# Patient Record
Sex: Female | Born: 1935 | Race: White | Hispanic: No | Marital: Single | Smoking: Never smoker
Health system: Southern US, Community
[De-identification: ages and names within clinical notes are randomized; demographics above are authoritative.]

## PROBLEM LIST (undated history)

## (undated) DIAGNOSIS — G8929 Other chronic pain: Secondary | ICD-10-CM

## (undated) DIAGNOSIS — I2510 Atherosclerotic heart disease of native coronary artery without angina pectoris: Secondary | ICD-10-CM

## (undated) DIAGNOSIS — K219 Gastro-esophageal reflux disease without esophagitis: Secondary | ICD-10-CM

## (undated) DIAGNOSIS — Z9109 Other allergy status, other than to drugs and biological substances: Secondary | ICD-10-CM

## (undated) DIAGNOSIS — I10 Essential (primary) hypertension: Secondary | ICD-10-CM

## (undated) DIAGNOSIS — Z8639 Personal history of other endocrine, nutritional and metabolic disease: Secondary | ICD-10-CM

## (undated) DIAGNOSIS — M549 Dorsalgia, unspecified: Secondary | ICD-10-CM

## (undated) DIAGNOSIS — G459 Transient cerebral ischemic attack, unspecified: Secondary | ICD-10-CM

## (undated) DIAGNOSIS — N6019 Diffuse cystic mastopathy of unspecified breast: Secondary | ICD-10-CM

## (undated) DIAGNOSIS — D649 Anemia, unspecified: Secondary | ICD-10-CM

## (undated) DIAGNOSIS — E538 Deficiency of other specified B group vitamins: Secondary | ICD-10-CM

## (undated) DIAGNOSIS — H409 Unspecified glaucoma: Secondary | ICD-10-CM

## (undated) HISTORY — PX: OTHER SURGICAL HISTORY: SHX169

## (undated) HISTORY — PX: BREAST BIOPSY: SHX20

## (undated) HISTORY — PX: CATARACT EXTRACTION: SUR2

---

## 2004-09-22 ENCOUNTER — Ambulatory Visit: Payer: Self-pay | Admitting: Internal Medicine

## 2005-10-31 ENCOUNTER — Ambulatory Visit: Payer: Self-pay | Admitting: Internal Medicine

## 2007-01-30 ENCOUNTER — Ambulatory Visit: Payer: Self-pay | Admitting: Internal Medicine

## 2007-08-19 ENCOUNTER — Ambulatory Visit: Payer: Self-pay | Admitting: Vascular Surgery

## 2007-09-17 ENCOUNTER — Encounter: Payer: Self-pay | Admitting: Specialist

## 2007-10-12 ENCOUNTER — Encounter: Payer: Self-pay | Admitting: Specialist

## 2008-02-04 ENCOUNTER — Ambulatory Visit: Payer: Self-pay | Admitting: Internal Medicine

## 2009-03-09 ENCOUNTER — Ambulatory Visit: Payer: Self-pay | Admitting: Internal Medicine

## 2009-10-10 ENCOUNTER — Emergency Department: Payer: Self-pay | Admitting: Emergency Medicine

## 2009-10-20 ENCOUNTER — Emergency Department: Payer: Self-pay | Admitting: Emergency Medicine

## 2010-03-11 ENCOUNTER — Ambulatory Visit: Payer: Self-pay | Admitting: Internal Medicine

## 2010-03-17 ENCOUNTER — Ambulatory Visit: Payer: Self-pay | Admitting: Internal Medicine

## 2010-04-07 ENCOUNTER — Ambulatory Visit: Payer: Self-pay | Admitting: Surgery

## 2010-05-06 ENCOUNTER — Ambulatory Visit: Payer: Self-pay | Admitting: Surgery

## 2010-05-10 ENCOUNTER — Ambulatory Visit: Payer: Self-pay | Admitting: Surgery

## 2010-05-12 LAB — PATHOLOGY REPORT

## 2010-05-17 ENCOUNTER — Ambulatory Visit: Payer: Self-pay | Admitting: Radiation Oncology

## 2010-05-23 ENCOUNTER — Ambulatory Visit: Payer: Self-pay | Admitting: Radiation Oncology

## 2010-06-12 ENCOUNTER — Ambulatory Visit: Payer: Self-pay | Admitting: Radiation Oncology

## 2010-07-12 ENCOUNTER — Ambulatory Visit: Payer: Self-pay | Admitting: Radiation Oncology

## 2010-08-12 ENCOUNTER — Ambulatory Visit: Payer: Self-pay | Admitting: Radiation Oncology

## 2010-10-12 ENCOUNTER — Ambulatory Visit: Payer: Self-pay | Admitting: Radiation Oncology

## 2010-10-17 ENCOUNTER — Emergency Department: Payer: Self-pay | Admitting: Emergency Medicine

## 2010-11-12 ENCOUNTER — Ambulatory Visit: Payer: Self-pay | Admitting: Radiation Oncology

## 2011-03-15 ENCOUNTER — Ambulatory Visit: Payer: Self-pay | Admitting: General Surgery

## 2011-08-24 ENCOUNTER — Inpatient Hospital Stay: Payer: Self-pay | Admitting: Student

## 2011-08-24 LAB — COMPREHENSIVE METABOLIC PANEL
Alkaline Phosphatase: 89 U/L (ref 50–136)
Bilirubin,Total: 0.4 mg/dL (ref 0.2–1.0)
Co2: 25 mmol/L (ref 21–32)
Creatinine: 1.76 mg/dL — ABNORMAL HIGH (ref 0.60–1.30)
EGFR (African American): 29 — ABNORMAL LOW
Glucose: 113 mg/dL — ABNORMAL HIGH (ref 65–99)
Osmolality: 282 (ref 275–301)
Potassium: 4.1 mmol/L (ref 3.5–5.1)
SGPT (ALT): 10 U/L — ABNORMAL LOW
Sodium: 139 mmol/L (ref 136–145)
Total Protein: 6.1 g/dL — ABNORMAL LOW (ref 6.4–8.2)

## 2011-08-24 LAB — URINALYSIS, COMPLETE
Bacteria: NONE SEEN
Bilirubin,UR: NEGATIVE
Blood: NEGATIVE
Hyaline Cast: 4
Leukocyte Esterase: NEGATIVE
Nitrite: NEGATIVE
Ph: 6 (ref 4.5–8.0)
Protein: >=500
RBC,UR: <1 /HPF (ref 0–5)
Specific Gravity: 1.01 (ref 1.003–1.030)
Squamous Epithelial: 1
WBC UR: 1 /HPF (ref 0–5)

## 2011-08-24 LAB — MAGNESIUM: Magnesium: 1.9 mg/dL

## 2011-08-24 LAB — CBC
HCT: 29.8 % — ABNORMAL LOW (ref 35.0–47.0)
HGB: 9.8 g/dL — ABNORMAL LOW (ref 12.0–16.0)
MCH: 32.7 pg (ref 26.0–34.0)
MCHC: 33.1 g/dL (ref 32.0–36.0)
Platelet: 185 10*3/uL (ref 150–440)
WBC: 6.9 10*3/uL (ref 3.6–11.0)

## 2011-08-24 LAB — APTT: Activated PTT: 26.6 secs (ref 23.6–35.9)

## 2011-08-24 LAB — CK TOTAL AND CKMB (NOT AT ARMC)
CK, Total: 42 U/L (ref 21–215)
CK-MB: 1.1 ng/mL (ref 0.5–3.6)

## 2011-08-24 LAB — PROTIME-INR: Prothrombin Time: 12.4 secs (ref 11.5–14.7)

## 2011-08-25 DIAGNOSIS — I059 Rheumatic mitral valve disease, unspecified: Secondary | ICD-10-CM

## 2011-08-25 LAB — CBC WITH DIFFERENTIAL/PLATELET
Basophil #: 0 10*3/uL (ref 0.0–0.1)
Basophil %: 0.9 %
Eosinophil #: 0.3 10*3/uL (ref 0.0–0.7)
Eosinophil %: 5.4 %
Lymphocyte #: 0.5 10*3/uL — ABNORMAL LOW (ref 1.0–3.6)
Lymphocyte %: 10.8 %
MCH: 33 pg (ref 26.0–34.0)
MCHC: 33.4 g/dL (ref 32.0–36.0)
MCV: 99 fL (ref 80–100)
Monocyte %: 11.9 %
RDW: 12.6 % (ref 11.5–14.5)
WBC: 4.8 10*3/uL (ref 3.6–11.0)

## 2011-08-25 LAB — COMPREHENSIVE METABOLIC PANEL
Anion Gap: 9 (ref 7–16)
BUN: 24 mg/dL — ABNORMAL HIGH (ref 7–18)
Bilirubin,Total: 0.4 mg/dL (ref 0.2–1.0)
Chloride: 108 mmol/L — ABNORMAL HIGH (ref 98–107)
Co2: 26 mmol/L (ref 21–32)
EGFR (African American): 30 — ABNORMAL LOW
EGFR (Non-African Amer.): 26 — ABNORMAL LOW

## 2011-08-25 LAB — LIPID PANEL
HDL Cholesterol: 36 mg/dL — ABNORMAL LOW (ref 40–60)
Triglycerides: 124 mg/dL (ref 0–200)

## 2011-08-25 LAB — TROPONIN I: Troponin-I: 0.3 ng/mL — ABNORMAL HIGH

## 2011-08-25 LAB — CK TOTAL AND CKMB (NOT AT ARMC)
CK, Total: 38 U/L (ref 21–215)
CK, Total: 41 U/L (ref 21–215)
CK-MB: 1.2 ng/mL (ref 0.5–3.6)

## 2011-08-25 LAB — MAGNESIUM: Magnesium: 1.8 mg/dL

## 2011-08-26 LAB — BASIC METABOLIC PANEL
Anion Gap: 12 (ref 7–16)
BUN: 26 mg/dL — ABNORMAL HIGH (ref 7–18)
Calcium, Total: 8 mg/dL — ABNORMAL LOW (ref 8.5–10.1)
Co2: 24 mmol/L (ref 21–32)
Creatinine: 1.93 mg/dL — ABNORMAL HIGH (ref 0.60–1.30)
EGFR (African American): 26 — ABNORMAL LOW
EGFR (Non-African Amer.): 23 — ABNORMAL LOW
Glucose: 106 mg/dL — ABNORMAL HIGH (ref 65–99)
Sodium: 140 mmol/L (ref 136–145)

## 2011-08-27 LAB — TSH: Thyroid Stimulating Horm: 1.87 u[IU]/mL

## 2011-08-28 LAB — BASIC METABOLIC PANEL
Anion Gap: 9 (ref 7–16)
BUN: 27 mg/dL — ABNORMAL HIGH (ref 7–18)
Calcium, Total: 7.8 mg/dL — ABNORMAL LOW (ref 8.5–10.1)
Co2: 24 mmol/L (ref 21–32)
Creatinine: 2.02 mg/dL — ABNORMAL HIGH (ref 0.60–1.30)
EGFR (African American): 25 — ABNORMAL LOW
Glucose: 95 mg/dL (ref 65–99)
Osmolality: 284 (ref 275–301)
Potassium: 3.4 mmol/L — ABNORMAL LOW (ref 3.5–5.1)
Sodium: 140 mmol/L (ref 136–145)

## 2011-08-28 LAB — CBC WITH DIFFERENTIAL/PLATELET
Basophil #: 0 10*3/uL (ref 0.0–0.1)
Basophil %: 0.9 %
Eosinophil #: 0.2 10*3/uL (ref 0.0–0.7)
Eosinophil %: 2.7 %
HCT: 28.8 % — ABNORMAL LOW (ref 35.0–47.0)
HGB: 9.7 g/dL — ABNORMAL LOW (ref 12.0–16.0)
Lymphocyte #: 0.3 10*3/uL — ABNORMAL LOW (ref 1.0–3.6)
MCH: 33.2 pg (ref 26.0–34.0)
MCV: 99 fL (ref 80–100)
Monocyte %: 9.1 %
Neutrophil #: 4.6 10*3/uL (ref 1.4–6.5)
RBC: 2.93 10*6/uL — ABNORMAL LOW (ref 3.80–5.20)
RDW: 13.3 % (ref 11.5–14.5)
WBC: 5.7 10*3/uL (ref 3.6–11.0)

## 2011-08-29 LAB — BASIC METABOLIC PANEL
Anion Gap: 10 (ref 7–16)
Calcium, Total: 7.9 mg/dL — ABNORMAL LOW (ref 8.5–10.1)
Co2: 24 mmol/L (ref 21–32)
Creatinine: 2.01 mg/dL — ABNORMAL HIGH (ref 0.60–1.30)
EGFR (African American): 25 — ABNORMAL LOW
EGFR (Non-African Amer.): 21 — ABNORMAL LOW
Osmolality: 286 (ref 275–301)

## 2011-08-30 LAB — BASIC METABOLIC PANEL
Anion Gap: 10 (ref 7–16)
Co2: 23 mmol/L (ref 21–32)
Glucose: 105 mg/dL — ABNORMAL HIGH (ref 65–99)
Osmolality: 274 (ref 275–301)
Sodium: 134 mmol/L — ABNORMAL LOW (ref 136–145)

## 2011-08-31 LAB — BASIC METABOLIC PANEL
Calcium, Total: 8.1 mg/dL — ABNORMAL LOW (ref 8.5–10.1)
Co2: 26 mmol/L (ref 21–32)
Creatinine: 2.54 mg/dL — ABNORMAL HIGH (ref 0.60–1.30)
EGFR (Non-African Amer.): 16 — ABNORMAL LOW
Glucose: 97 mg/dL (ref 65–99)
Osmolality: 284 (ref 275–301)
Potassium: 3.9 mmol/L (ref 3.5–5.1)
Sodium: 139 mmol/L (ref 136–145)

## 2011-09-01 LAB — BASIC METABOLIC PANEL
BUN: 31 mg/dL — ABNORMAL HIGH (ref 7–18)
Calcium, Total: 8.2 mg/dL — ABNORMAL LOW (ref 8.5–10.1)
Creatinine: 2.42 mg/dL — ABNORMAL HIGH (ref 0.60–1.30)
EGFR (Non-African Amer.): 17 — ABNORMAL LOW
Glucose: 101 mg/dL — ABNORMAL HIGH (ref 65–99)
Potassium: 4 mmol/L (ref 3.5–5.1)
Sodium: 141 mmol/L (ref 136–145)

## 2011-09-01 LAB — IRON AND TIBC
Iron Bind.Cap.(Total): 197 ug/dL — ABNORMAL LOW (ref 250–450)
Iron Saturation: 21 %

## 2011-09-01 LAB — PROTEIN / CREATININE RATIO, URINE: Creatinine, Urine: 36.6 mg/dL (ref 30.0–125.0)

## 2011-09-01 LAB — SODIUM, URINE, RANDOM: Sodium, Urine Random: 68 mmol/L (ref 20–110)

## 2011-09-01 LAB — FERRITIN: Ferritin (ARMC): 108 ng/mL (ref 8–388)

## 2011-09-01 LAB — URINALYSIS, COMPLETE
Bilirubin,UR: NEGATIVE
Glucose,UR: NEGATIVE mg/dL (ref 0–75)
Ketone: NEGATIVE
Leukocyte Esterase: NEGATIVE
Nitrite: NEGATIVE
Ph: 6 (ref 4.5–8.0)

## 2011-09-02 LAB — BASIC METABOLIC PANEL
Anion Gap: 10 (ref 7–16)
BUN: 27 mg/dL — ABNORMAL HIGH (ref 7–18)
Chloride: 107 mmol/L (ref 98–107)
Co2: 26 mmol/L (ref 21–32)
Creatinine: 2.33 mg/dL — ABNORMAL HIGH (ref 0.60–1.30)
EGFR (African American): 21 — ABNORMAL LOW
EGFR (Non-African Amer.): 18 — ABNORMAL LOW
Glucose: 91 mg/dL (ref 65–99)
Osmolality: 290 (ref 275–301)
Potassium: 3.6 mmol/L (ref 3.5–5.1)
Sodium: 143 mmol/L (ref 136–145)

## 2011-09-03 LAB — BASIC METABOLIC PANEL
BUN: 24 mg/dL — ABNORMAL HIGH (ref 7–18)
Chloride: 107 mmol/L (ref 98–107)
EGFR (African American): 22 — ABNORMAL LOW
Osmolality: 280 (ref 275–301)
Potassium: 3.9 mmol/L (ref 3.5–5.1)

## 2011-09-04 LAB — PROTEIN ELECTROPHORESIS(ARMC)

## 2011-09-05 LAB — BASIC METABOLIC PANEL
Anion Gap: 8 (ref 7–16)
BUN: 22 mg/dL — ABNORMAL HIGH (ref 7–18)
Calcium, Total: 7.8 mg/dL — ABNORMAL LOW (ref 8.5–10.1)
Chloride: 106 mmol/L (ref 98–107)
Co2: 25 mmol/L (ref 21–32)
EGFR (African American): 23 — ABNORMAL LOW
EGFR (Non-African Amer.): 20 — ABNORMAL LOW
Glucose: 85 mg/dL (ref 65–99)
Osmolality: 280 (ref 275–301)
Potassium: 4.3 mmol/L (ref 3.5–5.1)
Sodium: 139 mmol/L (ref 136–145)

## 2011-09-06 LAB — HEMOGLOBIN: HGB: 9.3 g/dL — ABNORMAL LOW (ref 12.0–16.0)

## 2011-09-06 LAB — BASIC METABOLIC PANEL
Anion Gap: 8 (ref 7–16)
Co2: 27 mmol/L (ref 21–32)
EGFR (African American): 22 — ABNORMAL LOW
EGFR (Non-African Amer.): 19 — ABNORMAL LOW
Glucose: 105 mg/dL — ABNORMAL HIGH (ref 65–99)
Osmolality: 279 (ref 275–301)
Potassium: 3.8 mmol/L (ref 3.5–5.1)
Sodium: 138 mmol/L (ref 136–145)

## 2011-09-08 ENCOUNTER — Encounter: Payer: Self-pay | Admitting: Internal Medicine

## 2011-09-11 ENCOUNTER — Encounter: Payer: Self-pay | Admitting: Internal Medicine

## 2011-09-12 ENCOUNTER — Emergency Department: Payer: Self-pay

## 2011-09-12 LAB — COMPREHENSIVE METABOLIC PANEL
Albumin: 2.6 g/dL — ABNORMAL LOW (ref 3.4–5.0)
Alkaline Phosphatase: 65 U/L (ref 50–136)
Anion Gap: 8 (ref 7–16)
BUN: 46 mg/dL — ABNORMAL HIGH (ref 7–18)
Calcium, Total: 7.8 mg/dL — ABNORMAL LOW (ref 8.5–10.1)
Chloride: 100 mmol/L (ref 98–107)
Creatinine: 2.73 mg/dL — ABNORMAL HIGH (ref 0.60–1.30)
EGFR (Non-African Amer.): 15 — ABNORMAL LOW
Glucose: 92 mg/dL (ref 65–99)
SGOT(AST): 20 U/L (ref 15–37)
SGPT (ALT): 15 U/L
Sodium: 134 mmol/L — ABNORMAL LOW (ref 136–145)
Total Protein: 5.7 g/dL — ABNORMAL LOW (ref 6.4–8.2)

## 2011-09-12 LAB — DRUG SCREEN, URINE
Amphetamines, Ur Screen: NEGATIVE (ref ?–1000)
Barbiturates, Ur Screen: NEGATIVE (ref ?–200)
Benzodiazepine, Ur Scrn: NEGATIVE (ref ?–200)
Cannabinoid 50 Ng, Ur ~~LOC~~: NEGATIVE (ref ?–50)
Methadone, Ur Screen: NEGATIVE (ref ?–300)
Opiate, Ur Screen: NEGATIVE (ref ?–300)
Tricyclic, Ur Screen: NEGATIVE (ref ?–1000)

## 2011-09-12 LAB — CBC
HCT: 25.2 % — ABNORMAL LOW (ref 35.0–47.0)
HGB: 8.5 g/dL — ABNORMAL LOW (ref 12.0–16.0)
MCH: 33.4 pg (ref 26.0–34.0)
MCHC: 33.6 g/dL (ref 32.0–36.0)
MCV: 99 fL (ref 80–100)
Platelet: 204 10*3/uL (ref 150–440)
RBC: 2.54 10*6/uL — ABNORMAL LOW (ref 3.80–5.20)
RDW: 13.7 % (ref 11.5–14.5)
WBC: 7.2 10*3/uL (ref 3.6–11.0)

## 2011-09-12 LAB — URINALYSIS, COMPLETE
Bacteria: NONE SEEN
Glucose,UR: NEGATIVE mg/dL (ref 0–75)
Ketone: NEGATIVE
Protein: NEGATIVE
Specific Gravity: 1.003 (ref 1.003–1.030)
WBC UR: 25 /HPF (ref 0–5)

## 2011-09-12 LAB — SALICYLATE LEVEL: Salicylates, Serum: <1.7 mg/dL

## 2011-09-12 LAB — ETHANOL: Ethanol %: <0.003 % (ref 0.000–0.080)

## 2011-09-12 LAB — TSH: Thyroid Stimulating Horm: 1.7 u[IU]/mL

## 2011-09-12 LAB — ACETAMINOPHEN LEVEL: Acetaminophen: 3 ug/mL — ABNORMAL LOW

## 2011-09-13 LAB — BASIC METABOLIC PANEL
Anion Gap: 8 (ref 7–16)
Calcium, Total: 7.9 mg/dL — ABNORMAL LOW (ref 8.5–10.1)
Co2: 25 mmol/L (ref 21–32)
EGFR (African American): 17 — ABNORMAL LOW
Potassium: 4.6 mmol/L (ref 3.5–5.1)

## 2011-09-15 LAB — URINE CULTURE

## 2013-10-11 ENCOUNTER — Emergency Department: Payer: Self-pay | Admitting: Emergency Medicine

## 2014-07-05 NOTE — Consult Note (Signed)
PATIENT NAME:  Jill, Clayton MR#:  [id number] DATE OF BIRTH:  [birth year]  DATE OF CONSULTATION:  09/06/2011  REFERRING PHYSICIAN:  Vivien Presto, MD CONSULTING PHYSICIAN:  Verdie Shire, MD / Payton Emerald, NP  REASON FOR CONSULTATION: Continued nausea and belching.   HISTORY OF PRESENT ILLNESS: Jill Clayton is an 81 year old Caucasian female who has a significant past medical history of hypertension, hyperlipidemia, chronic kidney disease stage IV, and complete occlusive right renal artery and left renal artery stenosis, as well as history of breast cancer status post excision, osteoporosis, history of partial seizures, vitamin B12 deficiency, and basal cell carcinoma. She was admitted with a syncopal episode. She was found to have rhythm disturbance including bradycardia as well as SVT. She was found to have some concern for possible stenosis of left internal carotid artery and the patient was seen by vascular surgery for this concern.   Gastroenterology consultation is requested per family request as they are very concerned with the excessive amount of belching  which the patient has experienced during her hospitalization. The patient is a poor historian, but is able to state that she did not experience this belching while she was at home. Nausea is denied at this time, although it has been present during her hospitalization. No abdominal pain and no vomiting. Unable to elaborate when her last bowel movement was. No family members are present during interviewing process. The patient denies history of ulcers, denies that she has ever had an upper endoscopy performed in the past, and no evidence noted in review of EMR.   PAST MEDICAL HISTORY:  1. Hypertension.  2. Hyperlipidemia.  3. Chronic kidney disease stage IV.  4. Complete occlusive right renal artery and left renal artery stenosis. 5. History of breast cancer status post excision. 6. Basal cell carcinoma.   PAST SURGICAL HISTORY:   1. Tonsillectomy.  2. Right kidney.   FAMILY HISTORY: Noncontributory.   SOCIAL HISTORY: No tobacco or alcohol use.  REVIEW OF SYSTEMS: CONSTITUTIONAL: Denies any fevers. Significant for weight loss. HEENT: Denies any blurred vision. No dysphagia and no epistaxis. RESPIRATORY: No coughing, no wheezing, and no respiratory distress. CARDIOVASCULAR: Significant for arrhythmia on admission. No chest pain. GASTROINTESTINAL: See History of Present Illness. GENITOURINARY: No dysuria. No hematuria. ENDOCRINE: No polyuria or polydipsia. MUSCULOSKELETAL: Denies arthralgias or myalgias. All 10 systems are unremarkable in reviewing History and Physical on admission as well.   PHYSICAL EXAMINATION:   VITAL SIGNS: Temperature 97.8, pulse 78, respirations 18, blood pressure 136/53, and pulse oximetry 92% on room air.   GENERAL: Well-developed, slender 81 year old Caucasian female with audible evidence of  intractable belching except for when the patient was asked to hold her breath.   HEENT: Normocephalic, atraumatic. Pupils equal and reactive to light. Conjunctivae clear. Sclerae anicteric.   NECK: Supple. Trachea midline. No lymphadenopathy or thyromegaly.   PULMONARY: Symmetric rise and fall of chest. Clear to auscultation anteriorly as well as posteriorly.   CARDIOVASCULAR: Regular rhythm, S1 and S2.   ABDOMEN: Soft and nondistended. Bowel sounds in four quadrants. No bruits. No masses.   RECTAL: Deferred.   MUSCULOSKELETAL: Movement of all four extremities. No contractures. No clubbing.   SKIN: Warm and dry. Color pale. No lesions. No rashes.   EXTREMITIES: No edema.   PSYCH: Alert and oriented x3. Flat affect. Depressive mood.   LABORATORY/RADIOLOGIC: All laboratory studies as well as diagnostic studies reviewed by myself during her hospitalization. Most recent ones, chemistry, on today's date: Glucose 105, BUN 22,  creatinine 2.26, and calcium is low at 8.2. EGFR FOR non-African-American  is low at 19. Platelet count is 250 with a hemoglobin of 9.3.  Chest, single view, done 09/05/2011, reveals a small left pleural effusion and some basilar atelectasis and pulmonary vascular congestion with mild edema.   IMPRESSION AND PLAN: An 81 year old Caucasian female who presented to the hospital for concerns of syncopal episode and was found to have rhythm disturbance, bradycardia versus supraventricular tachycardia, known history of chronic renal disease stage IV as well as hypertension and hyperlipidemia. Gastroenterology consult is requested for excessive belching with associated nausea which according to the patient is resolved at this time. I do feel that there is possibly a psychosomatic component which is the underlying reason for excessive belching. It is possible that this is an underlying factor in reviewing EMR that her husband passed away at the beginning of this month. If the patient is not on PPI therapy and there is a concern of reflux, do recommend PPI therapy being initiated. In review of renal consultation note today, it is noted that palliative care is recommended. Agreement with palliative care evaluation. We will await their recommendations. I do not feel that endoscopic evaluation is warranted at this time. We will monitor. Would recommend though the initiation of simethicone 125 mg capsule one capsule before meals by 30 minutes and at bedtime as this will help absorb excessive gas production, which may be thus leading to excessive belching in correlation with possible dyspepsia.   Thank you for allowing [place] to participate in the care of Jill Clayton during her hospitalization.  These services provided by Payton Emerald, MS, APRN, [facility], FNP under collaborative agreement with Dr. Verdie Shire.  ____________________________ Payton Emerald, NP dsh:slb D: 09/06/2011 13:57:39 ET     T: 09/06/2011 16:17:36 ET        JOB#: [id number] DAWN Ruthe Mannan MD ELECTRONICALLY SIGNED 09/08/2011  16:47

## 2014-07-05 NOTE — Consult Note (Signed)
General Aspect Carotid stenosis    Present Illness The patient is an 81 year old female who presented to [facility] after an episode of syncope.  When she passed out, reportedly she lost consciousness. Patient brought in by EMS. Patient still was feeling nauseous even after she woke up. Denied any chest pain. No dizziness. Patient did not have any headache. No aura symptoms or post ictal behavior by ER report. This is the first ond only time this has occurred.  No report of AF or TIA symptoms.   PAST MEDICAL HISTORY: 1. History of right infiltrating ductal carcinoma of the breast with lymphovascular invasion. 2. History of depression. 3. Hypertension.  4. Osteoporosis. 5. Hyperlipidemia.  6. History of cerebrovascular infarcts.  7. Previous history of seizures.  8. History of B12 deficiency. 9. Anemia.  10. Low back pain.  11. Mild to moderate mitral regurgitation.   PAST SURGICAL HISTORY:  1. History of cataract surgery.  2. Basal cell carcinoma of the skin.  3. Breast biopsy.   Home Medications: Medication Instructions Status  atenolol 50 mg oral tablet 1 tab(s) orally once a day  Active  Evista 60 mg oral tablet 1 tab(s) orally once a day  Active  potassium chloride 10 mEq oral capsule, extended release 1 cap(s) orally once a day  Active  clonidine 0.1 mg oral tablet 1 tab(s) orally 2 times a day Active  furosemide 40 mg oral tablet 1 tab(s) orally 2 times a day Active  dipyridamole 75 mg oral tablet 1 tab(s) orally 2 times a day Active  citalopram 20 mg oral tablet 1 tab(s) orally once a day for depression Active  losartan 100 mg oral tablet 1 tab(s) orally once a day Active  nystatin-triamcinolone 100,000 units/g-0.1% topical cream 1 application sparingly and rub in well to affected areas 2 times a day, As Needed Active  Aspirin Low Dose 81 mg oral tablet 1 tab(s) orally once a day Active    Feldene: Hives  Codeine: GI Distress  Ace Inhibitors: Cough  Other -Explain in  Comment: GI Distress  Vasotec: Other  Tape: Other  Case History:   Family History Non-Contributory    Social History negative tobacco, negative ETOH, negative Illicit drugs   Review of Systems:   ROS Pt not able to provide ROS   Physical Exam:   GEN well developed, thin    HEENT dry oral mucosa, poor dentition    NECK supple  trachea midline    RESP normal resp effort  no use of accessory muscles    CARD regular rate  no carotid bruits  no JVD    ABD denies tenderness  soft    EXTR negative cyanosis/clubbing, negative edema    SKIN No rashes, skin turgor poor    PSYCH lethargic   Nursing/Ancillary Notes: **Vital Signs.:   15-Jun-13 11:56   Vital Signs Type Routine   Temperature Temperature (F) 97.4   Celsius 36.3   Temperature Source oral   Pulse Pulse 70   Pulse source per vital sign device   Respirations Respirations 18   Systolic BP Systolic BP 132   Diastolic BP (mmHg) Diastolic BP (mmHg) 59   Mean BP 83   BP Source vital sign device   Pulse Ox % Pulse Ox % 94   Pulse Ox Activity Level  At rest   Oxygen Delivery Room Air/ 21 %   Hepatic:  14-Jun-13 02:11    Bilirubin, Total 0.4   Alkaline Phosphatase 82  SGPT (ALT)  10 (12-78 NOTE: NEW REFERENCE RANGE 02/03/2011)   SGOT (AST) 19   Total Protein, Serum  5.8   Albumin, Serum  2.6  Cardiology:  14-Jun-13 18:30    Echo Doppler  Interpretation Summary   Left ventricular systolic function is normal. Ejection Fraction =  >55%. The transmitral spectral Doppler flow pattern is suggestive of  impaired LV relaxation. The right ventricular systolic function is  normal. The left atrial size is normal. There is mild to moderate  mitral regurgitation. Right ventricular systolic pressure is  elevated at 30-46mmHg.  Procedure:   The study was completed  bedside.   A two-dimensional transthoracic echocardiogram with colorflow and  Doppler was performed.  Left Ventricle   Ejection Fraction = >55%.    The transmitral spectral Doppler flow pattern is suggestive of  impaired LV relaxation.   The left ventricular wall motion is normal.   The left ventricle is normal in size.   There is mild concentric left ventricular hypertrophy.   Left ventricular systolic function is normal.  Right Ventricle   The right ventricle is normal size.   The right ventricular systolic function is normal.  Atria   The left atrial size is normal.   Right atrial size is normal.   There is no Doppler evidence for an atrial septal defect.  Mitral Valve   The mitral valve leaflets appear normal. There is no evidence of  stenosis, fluttering, or prolapse.   There is mild to moderate mitral regurgitation.   There is no mitral valve stenosis.  Tricuspid Valve   The tricuspid valve is normal.   There is mild tricuspid regurgitation.   Right ventricular systolic pressure is elevated at 30-70mmHg.  Aortic Valve   The aortic valve opens well.   Mild aortic regurgitation.   No hemodynamically significant valvular aortic stenosis.  Pulmonic Valve   The pulmonic valve leaflets are thin and pliable; valve motion is  normal.   Trace pulmonic valvular regurgitation.  Great Vessels   The aortic root is normal size.   The pulmonary is not well visualized.  Pericardium/Pleural   There is no pleural effusion.   No pericardial effusion.  MMode 2D Measurements and Calculations   IVSd: 1.3 cm   LVIDd: 3.7 cm   LVIDs: 2.6 cm   LVPWd: 1.3 cm   FS: 28 %   EF(Teich): 56 %   Ao root diam: 2.6 cm   ACS: 1.4 cm   LA dimension: 3.7 cm   LVOT diam: 1.7 cm  Doppler Measurements and Calculations   MV E point: 86 cm/sec   MV A point: 95 cm/sec   MV E/A: 0.90    MV V2 max: 105 cm/sec   MV max PG: 4.0 mmHg   MV V2 mean: 67 cm/sec   MV mean PG: 2.0 mmHg   MV V2 VTI: 36 cm   MV P1/2t max vel: 86 cm/sec   MV P1/2t: 81 msec   MVA(P1/2t): 2.7 cm2   MV dec slope: 313 cm/sec2   MV dec time: 0.28 sec   Ao V2 max:  179 cm/sec   Ao max PG: 13 mmHg   Ao V2 mean: 111 cm/sec   Ao mean PG: 5.8 mmHg   Ao V2 VTI: 36 cm   AVA(I,D): 1.7 cm2   AVA(V,D): 1.4 cm2   AI max vel: 178 cm/sec   AI max PG: 13 mmHg   AI dec slope: 307 cm/sec2   AI P1/2t: 170  msec   LV max PG: 5.0 mmHg   LV mean PG: 2.4 mmHg   LV V1 max: 112 cm/sec   LV V1 mean: 70 cm/sec   LV V1 VTI: 27 cm   MR max vel: 586 cm/sec   MR max PG: 137 mmHg   SV(LVOT): 60 ml   PA V2 max: 121 cm/sec   PA max PG: 6.0 mmHg   PA acc time: 0.12 sec   PI end-d vel: 60 cm/sec   TR Max vel: 288 cm/sec   TR Max PG: 32 mmHg   RVSP: 37 mmHg   RAP systole: 5.0 mmHg   PA pr(Accel): 25 mmHg  Reading Physician: Ida Rogue  Sonographer: Jaci Standard Interpreting Physician:  Ida Rogue,  electronically signed on  08-25-2011 19:21:24 Requesting Physician: Ida Rogue  Routine Chem:  14-Jun-13 02:11    Glucose, Serum 92   BUN  24   Creatinine (comp)  1.71   Sodium, Serum 143   Potassium, Serum 3.7   Chloride, Serum  108   CO2, Serum 26   Calcium (Total), Serum  8.1   Anion Gap 9   Osmolality (calc) 289   eGFR (African American)  30   eGFR (Non-African American)  26 (eGFR values <54mL/min/1.73 m2 may be an indication of chronic kidney disease (CKD). Calculated eGFR is useful in patients with stable renal function. The eGFR calculation will not be reliable in acutely ill patients when serum creatinine is changing rapidly. It is not useful in  patients on dialysis. The eGFR calculation may not be applicable to patients at the low and high extremes of body sizes, pregnant women, and vegetarians.)   Result Comment TROPONIN - RESULTS VERIFIED BY REPEAT TESTING.  - PREVIOUS CALL:08/24/11@1858 .Marland KitchenMarland KitchenTPL  Result(s) reported on 25 Aug 2011 at 03:27AM.   Magnesium, Serum 1.8 (1.8-2.4 THERAPEUTIC RANGE: 4-7 mg/dL TOXIC: > 10 mg/dL  -----------------------)   Hemoglobin A1c ([facility]) 5.0 (The American Diabetes Association recommends that a primary  goal of therapy should be <7% and that physicians should reevaluate the treatment regimen in patients with HbA1c values consistently >8%.)   Cholesterol, Serum 159   Triglycerides, Serum 124   HDL (INHOUSE)  36   VLDL Cholesterol Calculated 25   LDL Cholesterol Calculated 98 (Result(s) reported on 25 Aug 2011 at 03:55AM.)    09:27    Result Comment TROPONIN - RESULTS VERIFIED BY REPEAT TESTING.  - PREV. C/ 08-24-11 @1858  BY EAB.Marland KitchenAJO  Result(s) reported on 25 Aug 2011 at 10:35AM.  15-Jun-13 05:12    Glucose, Serum  106   BUN  26   Creatinine (comp)  1.93   Sodium, Serum 140   Potassium, Serum  3.4   Chloride, Serum 104   CO2, Serum 24   Calcium (Total), Serum  8.0   Anion Gap 12   Osmolality (calc) 285   eGFR (African American)  26   eGFR (Non-African American)  23 (eGFR values <17mL/min/1.73 m2 may be an indication of chronic kidney disease (CKD). Calculated eGFR is useful in patients with stable renal function. The eGFR calculation will not be reliable in acutely ill patients when serum creatinine ischanging rapidly. It is not useful in  patients on dialysis. The eGFR calculation may not be applicable to patients at the low and high extremes of body sizes, pregnant women, and vegetarians.)  Cardiac:  14-Jun-13 02:11    Troponin I  0.30 (0.00-0.05 0.05 ng/mL or less: NEGATIVE  Repeat testing in 3-6 hrs  if clinically  indicated. >0.05 ng/mL: POTENTIAL  MYOCARDIAL INJURY. Repeat  testing in 3-6 hrs if  clinically indicated. NOTE: An increase or decrease  of 30% or more on serial  testing suggests a  clinically important change)   CK, Total 38   CPK-MB, Serum 1.2 (Result(s) reported on 25 Aug 2011 at 03:16AM.)    09:27    Troponin I  0.31 (0.00-0.05 0.05 ng/mL or less: NEGATIVE  Repeat testing in 3-6 hrs  if clinically indicated. >0.05 ng/mL: POTENTIAL  MYOCARDIAL INJURY. Repeat  testing in 3-6 hrs if  clinically indicated. NOTE: An increase or decrease  of 30%  or more on serial  testing suggests a  clinically important change)   CK, Total 41   CPK-MB, Serum 1.5 (Result(s) reported on 25 Aug 2011 at 10:31AM.)  Routine Hem:  14-Jun-13 02:11    WBC (CBC) 4.8   RBC (CBC)  3.09   Hemoglobin (CBC)  10.2   Hematocrit (CBC)  30.5   Platelet Count (CBC) 190   MCV 99   MCH 33.0   MCHC 33.4   RDW 12.6   Neutrophil % 71.0   Lymphocyte % 10.8   Monocyte % 11.9   Eosinophil % 5.4   Basophil % 0.9   Neutrophil # 3.4   Lymphocyte #  0.5   Monocyte # 0.6   Eosinophil # 0.3   Basophil # 0.0 (Result(s) reported on 25 Aug 2011 at 02:46AM.)     Impression 1. Carotid stenosis.  By ultrasound report there is a visual stenosis but this is not corroborated by the velocities obtained on the scan.  Her symptoms to not correlate with unilateral stenosis as syncope is a global symptom not a focal symptom and I do not believe that her carotid lesion is symptomactic.  Given her renal function a CT angio is not reasonable.  I recommend that her cardiac work up continue and once she is discharged from the hospital she can follow up in my office for a repeat carotid scan to resolve this discrepency.  2. Syncope. At this time syncope is probably secondary to cardiac origin with bradycardia. Hold the beta blockers.  Continue the aspirin. Unable to give beta blocker due to bradycardia. If CT head is negative will start Lovenox for her and continue ARB.  3. Acute renal failure.  IV fluids for rehydration as needed.   Avoid nephrotoxic agents. Avoid Lasix and KCl.  4. Bradycardia. Because of bradycardia and will hold the clonidine also at this time. We will also check orthostatic vitals as well and use hydralazine for blood pressure issues. 5.           Hypertension.  Continue losartin    Plan level 4   Electronic Signatures: Hortencia Pilar (MD)  (Signed 15-Jun-13 16:22)  Authored: General Aspect/Present Illness, Home Medications, Allergies, History and Physical Exam,  Vital Signs, Labs, Impression/Plan   Last Updated: 15-Jun-13 16:22 by Hortencia Pilar (MD)
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
## 2014-07-05 NOTE — H&P (Signed)
PATIENT NAME:  Jill Clayton, WISENER MR#:  [id number] DATE OF BIRTH:  1919-08-22  DATE OF ADMISSION:  08/24/2011  PRIMARY CARE PHYSICIAN: Dr. Alonna Buckler  ER PHYSICIAN: Dr. Enedina Finner    CHIEF COMPLAINT: Syncope.   HISTORY OF PRESENT ILLNESS: 81 year old female with history of hypertension, breast cancer came in because patient had syncope while she was doing some work. She was at the bank. Patient says that she does not remember anything and before she passed out she was feeling very nauseous and she was thinking she could go home and take some rest. When she passed out reportedly she lost consciousness and we don't know how long she lost it but she said she had loss of consciousness for a good while. Patient brought in by EMS. Patient still was feeling nauseous even after she woke up. Denied any chest pain. No dizziness. Patient did not have any headache. No aura symptoms and no seizure activity. This is the first time it happened.   ALLERGIES: ACE inhibitor, codeine, Feldene, Vasotec, tape. Codeine causes GI upset. She also has allergy to ACE inhibitor causes cough. Feldene causes hives.   PAST MEDICAL HISTORY: 1. History of right breast cancer status post excision. History of infiltrating ductal carcinoma with history of lymphovascular invasion. She had radiation therapy also and saw Dr. Rushie Chestnut August last year.  2. History of depression. 3. Hypertension.  4. Osteoporosis. 5. Hyperlipidemia.  6. History of  previous cerebrovascular infarcts.  7. Previous history of seizures, complex partial seizures and she was on seizure medication before.  8. History of B12 deficiency. 9. Anemia.  10. Low back pain.  11. Mild to moderate mitral regurgitation.   PAST SURGICAL HISTORY:  1. History of cataract surgery.  2. 3. Breast biopsy.  4. Basal cell carcinoma of the skin.   FAMILY HISTORY: History of hypertension.   MEDICATIONS:  1. Aspirin 81 mg daily. 2. Atenolol 50 mg p.o. daily.  3. Celexa 20  mg p.o. daily.  4. Clonidine 0.1 mg p.o. b.i.d.  5. Dipyridamole 75 mg p.o. b.i.d.  6. Evista 60 mg daily. 7. Furosemide 40 mg p.o. b.i.d.  8. Losartan 100 mg p.o. daily.  9. Nystatin with triamcinolone as needed.  10. KCl 10 mEq p.o. daily.   REVIEW OF SYSTEMS: CONSTITUTIONAL: Denies any fever or fatigue. EYES: No blurred vision. ENT: No hearing loss. No epistaxis. No difficulty swallowing. CARDIOVASCULAR: No chest pain. No orthopnea. No palpitations. Had syncope this afternoon. GASTROINTESTINAL: She did have some nausea before and after syncope. No abdominal pain. ENDOCRINE: No polyuria, nocturia. INTEGUMENTARY: No skin rashes. MUSCULOSKELETAL: Denies any joint pains. NEUROLOGIC: No numbness or weakness. Patient says she does not even use a walker. Has a sitter at home and has history of possible partial complex seizures.  PSYCH: No anxiety or insomnia.   PHYSICAL EXAMINATION:  GENERAL: This is an 81 year old elderly female answering questions appropriately, not in distress. Alert, awake, oriented, answering questions appropriately. Memory is intact.  VITAL SIGNS: Heart rate 55, blood pressure 189/63, temperature 98.3, sats 98% on room air.    HEAD: Head atraumatic, normocephalic. Pupils equally reacting to light. Extraocular movements are intact.   ENT: No tympanic membrane congestion. No turbinate hypertrophy. No oropharyngeal erythema.   NECK: Normal range of motion. No JVD. No carotid bruits.    CARDIOVASCULAR: S1, S2 regular. No murmurs.   LUNGS: Clear to auscultation. No wheeze. No rales.   ABDOMEN: Soft, nontender, nondistended. Bowel sounds present.   EXTREMITIES: No extremity edema.  No cyanosis. No clubbing.   SKIN: No skin rashes.   NEUROLOGIC: II through XII intact. Deep tendon reflexes 2+ bilaterally. Sensations are intact. No dysarthria or aphasia.   PSYCH: Oriented to time, place, person.   LABORATORY, DIAGNOSTIC, AND RADIOLOGICAL DATA: EKG done by EMS showed has  sinus bradycardia 54 beats per minute; in the ER it showed sinus rhythm 64 beats with some PVCs and also prolonged QT interval. Patient's blood pressure when I saw her was 205/70 and heart rate was 80 beats per minute.   Other lab data shows urinalysis yellow urine, hazy colored and protein more than 500, leukocyte esterase negative, bacteria none. WBC 6.9, hemoglobin 9.8, hematocrit 29.8, platelets 185.    Electrolytes: Sodium 135, potassium 4.1, chloride 106, bicarbonate 25, BUN 23, creatinine 1.76, glucose 113, albumin 2.8. Troponin 0.33.   ASSESSMENT AND PLAN: Patient is an 81 year old female with:  1. Syncope and sinus bradycardia by EMS. At this time syncope is probably secondary to cardiac origin with bradycardia. Hold the beta blockers. Check her echo and carotid ultrasound and CT of the head and obtain cardiology consult. Patient had a non-ST-elevation myocardial infarction with elevated troponins. Denied chest pain but has some nausea and syncope so trend CK and troponin two more times. Continue the aspirin. Unable to give beta blocker due to bradycardia. If CT head is negative will start Lovenox for her and continue ARB, losartan at low dose, 50 mg daily.  2. Acute renal failure. No previous BNP available. Continue IV fluids normal saline at 100. Avoid nephrotoxic agents. Avoid Lasix and KCl. Give losartan and see how she does.  3. GI prophylaxis and deep vein thrombosis prophylaxis. 4. Bradycardia. Because of bradycardia and will hold the clonidine also at this time. We will also check orthostatic vitals as well and use hydralazine for blood pressure issues.   TIME SPENT ON THE PATIENT: About 60 minutes.   Patient told me she does not want to be on ventilator or cardiopulmonary resuscitation. Will put the CODE STATUS as DO NOT RESUSCITATE.   ____________________________ Katha HammingSnehalatha Mickey Esguerra, MD sk:cms D: 08/24/2011 20:32:39 ET T: 08/25/2011 00:00:42 ET JOB#: [id number]  cc: Katha HammingSnehalatha  Natali Lavallee, MD, <Dictator> Reola MosherAndrew S. Randa LynnLamb, MD Katha HammingSNEHALATHA Greysin Medlen MD ELECTRONICALLY SIGNED 09/19/2011 9:34

## 2014-07-05 NOTE — Consult Note (Signed)
PATIENT NAME:  Jill Clayton, Jill Clayton MR#:  [id number] DATE OF BIRTH:  [birth year]  DATE OF CONSULTATION:  08/25/2011  REFERRING PHYSICIAN:  Fredia Sorrow, MD  CONSULTING PHYSICIAN:  Lamar Blinks, MD  REASON FOR CONSULTATION: Syncope, hypertension and tachycardia.   CHIEF COMPLAINT: "I passed out."   HISTORY OF PRESENT ILLNESS: The patient is an 81 year old female with known hypertension but no apparent hyperlipidemia or previous cardiovascular disease who has had an episode where she was dizzy, nauseated, had mild chest pain, shortness of breath and put her down. At that time she had worsening symptoms and passed out. After coming to, the patient had felt better. She has been weak and fatigued over the last many weeks and has been mourning the loss of her husband. The patient's EKG has shown normal sinus rhythm, left ventricular hypertrophy and nonspecific ST changes. The patient has normal troponin, CK-MB without evidence of myocardial infarction. She has had some concerns of carotid atherosclerosis and will need further carotid Doppler to assess for the possibility of syncopal origin. She does have telemetry showing no evidence of telemetry abnormalities other than some bradycardia for which medication management has changed. Hypertension has been somewhat elevated and hydralazine has been given with better control. The patient did have an apparent ventricular tachycardia episode which is currently not documented but will be further investigated.   REVIEW OF SYSTEMS: The remainder of review of systems is negative for vision change, ringing in the ears, hearing loss, cough, congestion, heartburn, nausea, vomiting, diarrhea, bloody stools, stomach pain, extremity pain, leg weakness, cramping of the buttocks, known blood clots, headaches, nosebleeds, congestion, trouble swallowing, frequent urination, urination at night, muscle weakness, numbness, anxiety, depression, skin lesions, or skin rashes.   PAST  MEDICAL HISTORY:  1. Hypertension.  2. Hyperlipidemia.  3. Valvular heart disease.   FAMILY HISTORY: Father had a myocardial infarction at an early age.    SOCIAL HISTORY: Currently denies alcohol or tobacco use.   ALLERGIES: No known drug allergies.   CURRENT MEDICATIONS: As listed.   PHYSICAL EXAMINATION:  VITAL SIGNS: Blood pressure is 168/72 bilaterally, heart rate 72 upright, reclining, and regular.   GENERAL: She is a well-appearing female in no acute distress.   HEENT: No icterus, thyromegaly, ulcers, hemorrhage, or xanthelasma.   CARDIOVASCULAR: Regular rate and rhythm. Normal S1 and S2 without murmur, gallop, or rub. Point of maximal impulse is normal size and placement. Carotid upstroke is normal without bruit. Jugular venous pressure is normal.   LUNGS: Lungs are clear to auscultation with normal respirations.   ABDOMEN: Soft, nontender, without hepatosplenomegaly or masses. Abdominal aorta is normal size without bruit.   EXTREMITIES: 2+ bilateral pulses in dorsal, pedal, radial, and femoral arteries without lower extremity edema, cyanosis, clubbing, or ulcers.   NEUROLOGICAL: She is oriented to time, place, and person with normal mood and affect.   ASSESSMENT: An 81 year old female with hypertension, bradycardia, syncope, apparent tachycardia, valvular heart disease needing further treatment options.   RECOMMENDATIONS:  1. Continue serial ECG and enzymes to assess for possible myocardial infarction.  2. Echocardiogram for LV systolic dysfunction, valvular heart disease.  3. Carotid Doppler for assessment of syncope.  4. Hypertension control with medications that are not beta blockers.  5. Ambulate and follow for any further significant symptoms and consider treadmill EKG for chronotropic incompetence.    6. Further diagnostic testing and treatment options after above.  ____________________________ Lamar Blinks, MD bjk:cbb D: 08/25/2011 12:58:15  ET T: 08/25/2011 15:58:25 ET JOB#:  [id number]  cc: Lamar BlinksBruce J. Ming Kunka, MD, <Dictator> Lamar BlinksBRUCE J Anamaria Dusenbury MD ELECTRONICALLY SIGNED 08/30/2011 10:40

## 2014-07-05 NOTE — Discharge Summary (Signed)
PATIENT NAME:  Jill Clayton, Jill B MR#:  [id number] DATE OF BIRTH:  1919/08/15  DATE OF ADMISSION:  08/24/2011 DATE OF DISCHARGE:  09/08/2011  THIS DISCHARGE SUMMARY SHOULD COVER FROM 09/02/2011 THROUGH 09/08/2011 AND FOR PREVIOUS HOSPITALIZATION PLEASE SEE THE DICTATION BY DR. LATEEF DICTATED ON 09/01/2011.   PRIMARY CARE PHYSICIAN: Dr. Randa LynnLamb  CHIEF COMPLAINT: Syncope.   CONSULTANTS:  1. Cardiologist, Dr. Gwen PoundsKowalski. 2. Vascular surgery, Dr. Gilda CreaseSchnier. 3. Nephrology, Dr. Mady HaagensenMunsoor Lateef. 4. Psychiatry, Dr. Guss Bundehalla. 5. GI, Dr. Bluford Kaufmannh.    DISCHARGE DIAGNOSES:  1. Syncope, possibly cardiogenic in nature, bradycardia, supraventricular tachycardia and runs of nonsustained ventricular tachycardia.   2. Possible carotid artery stenosis on the left.  3. Elevated troponin, likely demand ischemia from poor renal clearance and renal insufficiency rather than acute coronary syndrome or myocardial infarction.  4. Malignant hypertension, likely rebound hypertension from clonidine withdrawal.  5. History of cerebrovascular accident.  6. Acute on chronic renal failure, stage IV.  7. History of anxiety.  8. Unspecified psychosis.  9. Hyperlipidemia.  10. Right breast cancer history.  11. 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  12. Left renal artery stenosis.  13. Stage IV chronic kidney disease.  14. History of complex partial seizures no longer requiring antiepileptics.  15. History of basal cell carcinoma.  16. Nausea and belching and likely psychosomatic in nature.  17. Chronic back pain.  18. Osteoporosis.  19. History of anemia.  20. Diastolic congestive heart failure, acute on chronic with moderate mitral regurgitation and mild pulmonary hypertension.   CODE STATUS: Patient is DO NOT RESUSCITATE.    DISCHARGE MEDICATIONS:  1. Evista 60 mg daily.  2. Dipyridamole 75 mg 2 times a day.  3. Citalopram 20 mg daily.  4. Losartan 100 mg daily.  5. Aspirin 81 mg daily.   6. Nystatin/triamcinolone 100,000 units/g 1% topical cream every 12 hours as needed for itching.  7. Furosemide 40 mg daily.  8. Acetaminophen 325 mg 2 tabs every four hours as needed for pain or fever.  9. Aluminum hydroxide/magnesium hydroxide/simethicone 200 mg/200 mg/20 mg per 5 mL oral suspension 30 mL q.4 hours as needed for indigestion or heartburn.  10. Isosorbide mononitrate 60 mg extended-release daily.  11. Atorvastatin 20 mg daily.  12. Olanzapine 2.5 mg at bedtime.  13. Amlodipine 10 mg in the morning and 5 mg at night at bedtime.  14. Gas-X extra strength 125 mg 1 tab orally q.i.d. 30 minutes before meals and at bedtime.  15. Vitamin D 50,000 international units once weekly. 16. Hydralazine 100 mg 3 times a day.   [facility]: [facility] nasal cannula continuously.   DIET: Low sodium.   ACTIVITY: As tolerated.   FOLLOW UP: Please follow up with her primary care physician within 1 to 2 weeks as well as your cardiologist within 1 to 2 weeks. Please follow up with Dr. Gilda CreaseSchnier for carotid ultrasound in 1 to 2 weeks. Please follow up with your nephrologist in 1 to 2 weeks.   NOTE: Patient will also be discharged with a 30 day event monitor.   HOSPITAL COURSE:  1. Syncope. There was no further syncopal episodes in the last week. Patient did have short runs of supraventricular tachycardia and sinus attack, however, there is no further significant bradycardias. At this point, beta blocker and clonidine have continued to be held and a 30 day event monitor has been arranged and it will be applied today by [employer] and  cardiologist has been informed.  2. Possible carotid artery stenosis. Again, she is to follow up with Dr. Gilda Crease as an outpatient for repeat ultrasound of the carotids and she is on aspirin, statin and dipyridamole.  3. Malignant hypertension. Patient did have significant problems with blood pressure and her medications have been  arranged. Currently the blood pressure is more stable. She is to continue the blood pressure medications above and continue monitoring and follow up with her primary care physician.  4. Acute on chronic kidney disease, stage IV. Per outpatient records her baseline creatinine ranges from 1.72 to 2 and currently it is somewhat higher. She was being followed by nephrologist and initially started on some fluids. Patient does have significant renal artery stenosis as well. She is on losartan as well. Initially Lasix was held and she was started on some fluids, however, creatinine did not improve significantly and at this point as she did have some hypoxia and volume overload her Lasix has been resumed at 40 mg per day dose and she is to follow up with her nephrologist and cardiologist as an outpatient.  5. Anxiety. Her Celexa was continued.  6. Belching/nausea. Patient did have off and on persistent nausea as well as belching. Maalox, PPI as well as simethicone were tried with some success, however, as they were persistent GI was consulted and patient was seen by Dr. Bluford Kaufmann. It was thought that perhaps this is psychosomatic in nature and she currently is placed on around-the-clock simethicone with good improvement. She is tolerating her diet without significant belching.  7. At this point, she would be discharged with follow up as above. Patient is DO NOT RESUSCITATE.   TOTAL TIME SPENT: 40 minutes.   ____________________________ Krystal Eaton, MD sa:cms D: 09/08/2011 09:37:02 ET T: 09/08/2011 09:56:45 ET  JOB#: [id number] cc: Krystal Eaton, MD, <Dictator> Reola Mosher. Randa Lynn, MD Krystal Eaton MD ELECTRONICALLY SIGNED 09/16/2011 13:09

## 2014-07-05 NOTE — Consult Note (Signed)
    Comments   Consult noted. Pt familiar to our service from recent prolonged hospitalization of her husband. Spoke with attending who states patient improved today. Plan is likely for STR and there was some question if patient would benefit from hospice involvement. She may qualify for hospice services at home if this was the preferred disposition or hospice may be able to follow after rehab but [facility], hospice would not be able to follow patient while at rehab. Attending asks that we hold on seeing patient for now. Please reconsult if needed.     Electronic Signatures: Louden Houseworth, Daryl EasternJoshua R (NP)  (Signed 27-Jun-13 11:57)  Authored: Palliative Care   Last Updated: 27-Jun-13 11:57 by Malachy MoanBorders, Virdie Penning R (NP)

## 2014-07-05 NOTE — Consult Note (Signed)
Brief Consult Note: Diagnosis: Admitted for syncopal episode found to have arrthymia of SVT and bradycardia.  GI consultation requested due to nausea and excessive belching.  Known history of CKD IV.   Consult note dictated.   Orders entered.   Discussed with Attending MD.   Comments: Patient's presentation discussed with Dr. Lutricia FeilPaul Oh.  Possible excessive belching is pyschosomatic in nature.  Possible given loss of her husband earlier this month. If patient has been experiencing reflux and is not currently on PPI therapy would recommend start therapy.  Renal consultation note stated recommendation of palliative care.  Will await palliatiave input.  Will continue to monitor.  Do not recommend endoscopic evaluation at this time.  Do recommend the consideration of Simethicone 125 mg capsules po 30 minutes ac meals and at bedtime.  Electronic Signatures: Rodman KeyHarrison, Misaki Sozio S (NP)  (Signed 26-Jun-13 14:04)  Authored: Brief Consult Note   Last Updated: 26-Jun-13 14:04 by Rodman KeyHarrison, Madia Carvell S (NP)
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
## 2014-07-05 NOTE — Consult Note (Signed)
Pt seen and examined. See Dawn Harrison's notes. Pt initially sleeping without belching and then started belching when awake. For now, daily PPI plus simethicone. No EGD planned at this time. Will follow. Thanks.  Electronic Signatures: Lutricia Feilh, Stelios Kirby (MD)  (Signed on 26-Jun-13 16:19)  Authored  Last Updated: 26-Jun-13 16:19 by Lutricia Feilh, Julann Mcgilvray (MD)

## 2014-09-23 ENCOUNTER — Other Ambulatory Visit: Payer: Self-pay

## 2014-09-23 ENCOUNTER — Inpatient Hospital Stay
Admission: EM | Admit: 2014-09-23 | Discharge: 2014-09-25 | DRG: 871 | Disposition: A | Discharge status: Skilled Nursing Facility | Payer: Medicare Other | Attending: Internal Medicine | Admitting: Internal Medicine

## 2014-09-23 ENCOUNTER — Emergency Department: Payer: Medicare Other

## 2014-09-23 ENCOUNTER — Encounter: Payer: Self-pay | Admitting: *Deleted

## 2014-09-23 DIAGNOSIS — R944 Abnormal results of kidney function studies: Secondary | ICD-10-CM | POA: Diagnosis present

## 2014-09-23 DIAGNOSIS — Z66 Do not resuscitate: Secondary | ICD-10-CM | POA: Diagnosis present

## 2014-09-23 DIAGNOSIS — D649 Anemia, unspecified: Secondary | ICD-10-CM | POA: Diagnosis present

## 2014-09-23 DIAGNOSIS — N184 Chronic kidney disease, stage 4 (severe): Secondary | ICD-10-CM | POA: Diagnosis present

## 2014-09-23 DIAGNOSIS — J189 Pneumonia, unspecified organism: Secondary | ICD-10-CM

## 2014-09-23 DIAGNOSIS — K219 Gastro-esophageal reflux disease without esophagitis: Secondary | ICD-10-CM | POA: Diagnosis present

## 2014-09-23 DIAGNOSIS — G9349 Other encephalopathy: Secondary | ICD-10-CM | POA: Diagnosis present

## 2014-09-23 DIAGNOSIS — IMO0002 Reserved for concepts with insufficient information to code with codable children: Secondary | ICD-10-CM

## 2014-09-23 DIAGNOSIS — M549 Dorsalgia, unspecified: Secondary | ICD-10-CM | POA: Diagnosis present

## 2014-09-23 DIAGNOSIS — Z515 Encounter for palliative care: Secondary | ICD-10-CM | POA: Diagnosis not present

## 2014-09-23 DIAGNOSIS — N6019 Diffuse cystic mastopathy of unspecified breast: Secondary | ICD-10-CM | POA: Diagnosis present

## 2014-09-23 DIAGNOSIS — G8929 Other chronic pain: Secondary | ICD-10-CM | POA: Diagnosis present

## 2014-09-23 DIAGNOSIS — N649 Disorder of breast, unspecified: Secondary | ICD-10-CM | POA: Diagnosis present

## 2014-09-23 DIAGNOSIS — E538 Deficiency of other specified B group vitamins: Secondary | ICD-10-CM | POA: Diagnosis present

## 2014-09-23 DIAGNOSIS — Z79899 Other long term (current) drug therapy: Secondary | ICD-10-CM

## 2014-09-23 DIAGNOSIS — Z8673 Personal history of transient ischemic attack (TIA), and cerebral infarction without residual deficits: Secondary | ICD-10-CM

## 2014-09-23 DIAGNOSIS — E875 Hyperkalemia: Secondary | ICD-10-CM | POA: Diagnosis present

## 2014-09-23 DIAGNOSIS — H409 Unspecified glaucoma: Secondary | ICD-10-CM | POA: Diagnosis present

## 2014-09-23 DIAGNOSIS — A419 Sepsis, unspecified organism: Secondary | ICD-10-CM

## 2014-09-23 DIAGNOSIS — J9601 Acute respiratory failure with hypoxia: Secondary | ICD-10-CM | POA: Diagnosis present

## 2014-09-23 DIAGNOSIS — I248 Other forms of acute ischemic heart disease: Secondary | ICD-10-CM | POA: Diagnosis present

## 2014-09-23 DIAGNOSIS — I2510 Atherosclerotic heart disease of native coronary artery without angina pectoris: Secondary | ICD-10-CM | POA: Diagnosis present

## 2014-09-23 DIAGNOSIS — R6520 Severe sepsis without septic shock: Secondary | ICD-10-CM | POA: Diagnosis present

## 2014-09-23 DIAGNOSIS — I129 Hypertensive chronic kidney disease with stage 1 through stage 4 chronic kidney disease, or unspecified chronic kidney disease: Secondary | ICD-10-CM | POA: Diagnosis present

## 2014-09-23 DIAGNOSIS — N170 Acute kidney failure with tubular necrosis: Secondary | ICD-10-CM | POA: Diagnosis present

## 2014-09-23 HISTORY — DX: Atherosclerotic heart disease of native coronary artery without angina pectoris: I25.10

## 2014-09-23 HISTORY — DX: Other chronic pain: G89.29

## 2014-09-23 HISTORY — DX: Essential (primary) hypertension: I10

## 2014-09-23 HISTORY — DX: Gastro-esophageal reflux disease without esophagitis: K21.9

## 2014-09-23 HISTORY — DX: Personal history of other endocrine, nutritional and metabolic disease: Z86.39

## 2014-09-23 HISTORY — DX: Other allergy status, other than to drugs and biological substances: Z91.09

## 2014-09-23 HISTORY — DX: Deficiency of other specified B group vitamins: E53.8

## 2014-09-23 HISTORY — DX: Diffuse cystic mastopathy of unspecified breast: N60.19

## 2014-09-23 HISTORY — DX: Transient cerebral ischemic attack, unspecified: G45.9

## 2014-09-23 HISTORY — DX: Unspecified glaucoma: H40.9

## 2014-09-23 HISTORY — DX: Anemia, unspecified: D64.9

## 2014-09-23 HISTORY — DX: Dorsalgia, unspecified: M54.9

## 2014-09-23 LAB — URINALYSIS COMPLETE WITH MICROSCOPIC (ARMC ONLY)
Bilirubin Urine: NEGATIVE
Glucose, UA: NEGATIVE mg/dL
Hgb urine dipstick: NEGATIVE
KETONES UR: NEGATIVE mg/dL
Leukocytes, UA: NEGATIVE
NITRITE: NEGATIVE
Specific Gravity, Urine: 1.022 (ref 1.005–1.030)
pH: 5 (ref 5.0–8.0)

## 2014-09-23 LAB — BLOOD GAS, ARTERIAL
Acid-base deficit: 3.6 mmol/L — ABNORMAL HIGH (ref 0.0–2.0)
Allens test (pass/fail): POSITIVE — AB
Bicarbonate: 20.4 mEq/L — ABNORMAL LOW (ref 21.0–28.0)
FIO2: 0.44 %
O2 Saturation: 90.2 %
PO2 ART: 59 mmHg — AB (ref 83.0–108.0)
Patient temperature: 37
pCO2 arterial: 33 mmHg (ref 32.0–48.0)
pH, Arterial: 7.4 (ref 7.350–7.450)

## 2014-09-23 LAB — BASIC METABOLIC PANEL
Anion gap: 12 (ref 5–15)
BUN: 47 mg/dL — ABNORMAL HIGH (ref 6–20)
CO2: 19 mmol/L — ABNORMAL LOW (ref 22–32)
Calcium: 8.3 mg/dL — ABNORMAL LOW (ref 8.9–10.3)
Chloride: 109 mmol/L (ref 101–111)
Creatinine, Ser: 4.15 mg/dL — ABNORMAL HIGH (ref 0.44–1.00)
GFR calc Af Amer: 10 mL/min — ABNORMAL LOW (ref >60)
GFR, EST NON AFRICAN AMERICAN: 8 mL/min — AB (ref >60)
Glucose, Bld: 105 mg/dL — ABNORMAL HIGH (ref 65–99)
Potassium: 5.4 mmol/L — ABNORMAL HIGH (ref 3.5–5.1)
Sodium: 140 mmol/L (ref 135–145)

## 2014-09-23 LAB — CBC WITH DIFFERENTIAL/PLATELET
BASOS ABS: 0.1 10*3/uL (ref 0–0.1)
Basophils Relative: 0 %
EOS ABS: 0 10*3/uL (ref 0–0.7)
EOS PCT: 0 %
HCT: 30.8 % — ABNORMAL LOW (ref 35.0–47.0)
Hemoglobin: 10 g/dL — ABNORMAL LOW (ref 12.0–16.0)
Lymphocytes Relative: 2 %
Lymphs Abs: 0.5 10*3/uL — ABNORMAL LOW (ref 1.0–3.6)
MCH: 34 pg (ref 26.0–34.0)
MCHC: 32.5 g/dL (ref 32.0–36.0)
MCV: 104.4 fL — ABNORMAL HIGH (ref 80.0–100.0)
MONO ABS: 1.7 10*3/uL — AB (ref 0.2–0.9)
Monocytes Relative: 8 %
NEUTROS ABS: 19.8 10*3/uL — AB (ref 1.4–6.5)
NEUTROS PCT: 90 %
Platelets: 316 10*3/uL (ref 150–440)
RBC: 2.95 MIL/uL — AB (ref 3.80–5.20)
RDW: 13.6 % (ref 11.5–14.5)
WBC: 22 10*3/uL — AB (ref 3.6–11.0)

## 2014-09-23 LAB — LACTIC ACID, PLASMA
LACTIC ACID, VENOUS: 2.9 mmol/L — AB (ref 0.5–2.0)
LACTIC ACID, VENOUS: 1.5 mmol/L (ref 0.5–2.0)

## 2014-09-23 LAB — TROPONIN I
TROPONIN I: 0.67 ng/mL — AB (ref <0.031)
TROPONIN I: 1.19 ng/mL — AB (ref <0.031)
Troponin I: 0.89 ng/mL — ABNORMAL HIGH (ref <0.031)

## 2014-09-23 MED ORDER — ISOSORBIDE MONONITRATE ER 60 MG PO TB24: 60.0000 mg | ORAL_TABLET | Freq: Every day | ORAL | Status: DC

## 2014-09-23 MED ORDER — HEPARIN SODIUM (PORCINE) 5000 UNIT/ML IJ SOLN
5000.0000 [IU] | Freq: Three times a day (TID) | INTRAMUSCULAR | Status: DC
  Administered 2014-09-23 – 2014-09-25 (×5): 5000 [IU] via SUBCUTANEOUS
  Filled 2014-09-23 (×5): qty 1

## 2014-09-23 MED ORDER — FUROSEMIDE 10 MG/ML IJ SOLN
20.0000 mg | Freq: Once | INTRAMUSCULAR | Status: AC
  Administered 2014-09-23: 20 mg via INTRAVENOUS
  Filled 2014-09-23: qty 2

## 2014-09-23 MED ORDER — LEVOFLOXACIN IN D5W 500 MG/100ML IV SOLN
500.0000 mg | INTRAVENOUS | Status: DC
  Administered 2014-09-25: 500 mg via INTRAVENOUS
  Filled 2014-09-23: qty 100

## 2014-09-23 MED ORDER — SODIUM CHLORIDE 0.9 % IV BOLUS (SEPSIS)
1000.0000 mL | Freq: Once | INTRAVENOUS | Status: AC
  Administered 2014-09-23: 1000 mL via INTRAVENOUS

## 2014-09-23 MED ORDER — CARVEDILOL 12.5 MG PO TABS: 12.5000 mg | ORAL_TABLET | Freq: Two times a day (BID) | ORAL | Status: DC

## 2014-09-23 MED ORDER — ACETAMINOPHEN 325 MG PO TABS: 650.0000 mg | ORAL_TABLET | Freq: Four times a day (QID) | ORAL | Status: DC | PRN

## 2014-09-23 MED ORDER — ASPIRIN EC 81 MG PO TBEC: 81.0000 mg | DELAYED_RELEASE_TABLET | Freq: Every day | ORAL | Status: DC

## 2014-09-23 MED ORDER — ONDANSETRON HCL 4 MG PO TABS: 4.0000 mg | ORAL_TABLET | Freq: Four times a day (QID) | ORAL | Status: DC | PRN

## 2014-09-23 MED ORDER — PIPERACILLIN-TAZOBACTAM 3.375 G IVPB
3.3750 g | Freq: Once | INTRAVENOUS | Status: AC
  Administered 2014-09-23: 3.375 g via INTRAVENOUS
  Filled 2014-09-23: qty 50

## 2014-09-23 MED ORDER — LEVOFLOXACIN IN D5W 750 MG/150ML IV SOLN
750.0000 mg | Freq: Once | INTRAVENOUS | Status: AC
  Administered 2014-09-23: 750 mg via INTRAVENOUS
  Filled 2014-09-23: qty 150

## 2014-09-23 MED ORDER — SODIUM CHLORIDE 0.9 % IJ SOLN
3.0000 mL | Freq: Two times a day (BID) | INTRAMUSCULAR | Status: DC
  Administered 2014-09-23 – 2014-09-25 (×4): 3 mL via INTRAVENOUS

## 2014-09-23 MED ORDER — AMLODIPINE BESYLATE 5 MG PO TABS: 5.0000 mg | ORAL_TABLET | Freq: Every day | ORAL | Status: DC

## 2014-09-23 MED ORDER — PANTOPRAZOLE SODIUM 40 MG PO TBEC: 40.0000 mg | DELAYED_RELEASE_TABLET | Freq: Every day | ORAL | Status: DC

## 2014-09-23 MED ORDER — VITAMIN C 500 MG PO TABS: 500.0000 mg | ORAL_TABLET | Freq: Every day | ORAL | Status: DC

## 2014-09-23 MED ORDER — DIPYRIDAMOLE 75 MG PO TABS
75.0000 mg | ORAL_TABLET | Freq: Two times a day (BID) | ORAL | Status: DC
  Filled 2014-09-23 (×5): qty 1

## 2014-09-23 MED ORDER — ONDANSETRON HCL 4 MG/2ML IJ SOLN: 4.0000 mg | Freq: Four times a day (QID) | INTRAMUSCULAR | Status: DC | PRN

## 2014-09-23 MED ORDER — VANCOMYCIN HCL IN DEXTROSE 1-5 GM/200ML-% IV SOLN
1000.0000 mg | Freq: Once | INTRAVENOUS | Status: AC
  Administered 2014-09-23: 1000 mg via INTRAVENOUS
  Filled 2014-09-23: qty 200

## 2014-09-23 MED ORDER — LEVOFLOXACIN IN D5W 500 MG/100ML IV SOLN
500.0000 mg | INTRAVENOUS | Status: DC
  Filled 2014-09-23: qty 100

## 2014-09-23 MED ORDER — FERROUS SULFATE 325 (65 FE) MG PO TABS: 325.0000 mg | ORAL_TABLET | Freq: Two times a day (BID) | ORAL | Status: DC

## 2014-09-23 MED ORDER — SODIUM CHLORIDE 0.9 % IV SOLN
INTRAVENOUS | Status: DC
  Administered 2014-09-23: 16:00:00 via INTRAVENOUS

## 2014-09-23 MED ORDER — ACETAMINOPHEN 650 MG RE SUPP: 650.0000 mg | Freq: Four times a day (QID) | RECTAL | Status: DC | PRN

## 2014-09-23 MED ORDER — SODIUM CHLORIDE 0.9 % IV BOLUS (SEPSIS)
500.0000 mL | Freq: Once | INTRAVENOUS | Status: AC
  Administered 2014-09-23: 500 mL via INTRAVENOUS

## 2014-09-23 NOTE — ED Notes (Signed)
Dr. Derrill KayGoodman notified of critical lab values

## 2014-09-23 NOTE — ED Notes (Signed)
Pt arrives via EMS from home, caregiver states pt was breathing shallow and SOB this AM, pt was lethargic, caregiver states pt normally is awake and alert and ambulatory with a WALKER, pt arrives on NRB, pt responsive to verbal, pt shaking, RT at bedside, MD at bedside, pt placed on  4L [place] after assessment

## 2014-09-23 NOTE — H&P (Signed)
[facility] [place] [facility]   PATIENT NAME: Jill LighterHazel Clayton    MR#:  [id number]  DATE OF BIRTH:  [birth year]  DATE OF ADMISSION:  09/23/2014  PRIMARY CARE PHYSICIAN:  Dr. Madelin RearBaboff REQUESTING/REFERRING PHYSICIAN: Dr. Derrill KayGoodman  CHIEF COMPLAINT:   Chief Complaint  Patient presents with  . Weakness  . Shortness of Breath   History of present illness  Patient is a 81 year old the Caucasian female with history of hypertension, TIA, GERD, chronic low back, arthrosclerotic coronary artery disease who stays at home and has a caregiver around the clock. According to the caregiver last night patient didn't get up onto asked to go to the bathroom. This morning when she went to see her she kept staring at her with a dazed look. Patient was also apparently having some shortness of breath and a foul-smelling urine. She was brought to the ER. Unfortunately patient is confused and unable to give me any history. I spoke to her son Orvilla Fusommy who provided me with history. Patient in the emergency room was noted to have tachycardia, leukocytosis, acute renal failure, pneumonia on chest x-ray.    PAST MEDICAL HISTORY:   Past Medical History  Diagnosis Date  . Anemia   . Chronic back pain   . Glaucoma   . Fibrocystic breast disease   . HTN (hypertension)   . TIA (transient ischemic attack)   . CAD (coronary artery disease)   . GERD (gastroesophageal reflux disease)   . B12 deficiency   . Chronic back pain   . Glaucoma   . H/O thyroid nodule   . Environmental allergies     PAST SURGICAL HISTORY:  Past Surgical History  Procedure Laterality Date  . Breast biopsy    . Basal cell cancer    . Cataract extraction    . Renal a stenosis      SOCIAL HISTORY:  History  Substance Use Topics  . Smoking status: Never Smoker   . Smokeless tobacco: Not on file  . Alcohol Use: No    FAMILY HISTORY:  Family History  Problem Relation Age of Onset  . Hypertension       DRUG ALLERGIES: No known drug allergy   REVIEW OF SYSTEMS:   Unable to provide due to her current mental status    MEDICATIONS AT HOME:  Prior to Admission medications   Medication Sig Start Date End Date Taking? Authorizing Provider  acetaminophen (TYLENOL) 500 MG tablet Take 1,000 mg by mouth 2 (two) times daily.   Yes Historical Provider, MD  amLODipine (NORVASC) 5 MG tablet Take 1 tablet by mouth daily. 06/25/14  Yes Historical Provider, MD  aspirin 81 MG tablet Take 81 mg by mouth daily.   Yes Historical Provider, MD  buPROPion (WELLBUTRIN SR) 100 MG 12 hr tablet Take 1 tablet by mouth 2 (two) times daily. 08/16/14  Yes Historical Provider, MD  carvedilol (COREG) 12.5 MG tablet Take 1 tablet by mouth 2 (two) times daily. 08/29/14  Yes Historical Provider, MD  dipyridamole (PERSANTINE) 75 MG tablet Take 1 tablet by mouth 2 (two) times daily. 09/15/14  Yes Historical Provider, MD  ferrous sulfate 325 (65 FE) MG tablet Take 325 mg by mouth 2 (two) times daily. 08/14/14  Yes Historical Provider, MD  isosorbide mononitrate (IMDUR) 60 MG 24 hr tablet Take 1 tablet by mouth daily. 09/15/14  Yes Historical Provider, MD  losartan (COZAAR) 100 MG tablet Take 1 tablet by mouth every morning. 07/19/14  Yes Historical  Provider, MD  Multiple Vitamin (MULTIVITAMIN) capsule Take 1 capsule by mouth daily.   Yes Historical Provider, MD  OLANZapine (ZYPREXA) 2.5 MG tablet Take 1 tablet by mouth every evening. 08/12/14  Yes Historical Provider, MD  omeprazole (PRILOSEC) 20 MG capsule Take 1 capsule by mouth every morning. 09/15/14  Yes Historical Provider, MD  vitamin C (ASCORBIC ACID) 500 MG tablet Take 500 mg by mouth daily.   Yes Historical Provider, MD      PHYSICAL EXAMINATION:   VITAL SIGNS: Blood pressure 143/76, pulse 126, temperature 97.8 F (36.6 C), temperature source Rectal, resp. rate 21, height  (1.626 m), weight 41.731 kg (92 lb), SpO2 94 %.  GENERAL:  81 y.o.-year-old patient lying in the  bed with no acute distress.  EYES: Pupils equal, round, reactive to light and accommodation. No scleral icterus. Extraocular muscles intact.  HEENT: Head atraumatic, normocephalic. Oropharynx and nasopharynx clear.  NECK:  Supple, no jugular venous distention. No thyroid enlargement, no tenderness.  LUNGS: Has bilateral rhonchi without any accesory  muscle usage CARDIOVASCULAR: S1, S2 normal. No murmurs, rubs, or gallops.  ABDOMEN: Soft, nontender, nondistended. Bowel sounds present. No organomegaly or mass.  EXTREMITIES: No pedal edema, cyanosis, or clubbing.  NEUROLOGIC: Moving all extremities spontaneously  PSYCHIATRIC: The patient arousable but is not oriented  SKIN: No obvious rash, lesion, or ulcer.   LABORATORY PANEL:   CBC  Recent Labs Lab 09/23/14 0855  WBC 22.0*  HGB 10.0*  HCT 30.8*  PLT 316  MCV 104.4*  MCH 34.0  MCHC 32.5  RDW 13.6  LYMPHSABS 0.5*  MONOABS 1.7*  EOSABS 0.0  BASOSABS 0.1   ------------------------------------------------------------------------------------------------------------------  Chemistries   Recent Labs Lab 09/23/14 0855  NA 140  K 5.4*  CL 109  CO2 19*  GLUCOSE 105*  BUN 47*  CREATININE 4.15*  CALCIUM 8.3*   ------------------------------------------------------------------------------------------------------------------ estimated creatinine clearance is 5.7 mL/min (by C-G formula based on Cr of 4.15). ------------------------------------------------------------------------------------------------------------------ No results for input(s): TSH, T4TOTAL, T3FREE, THYROIDAB in the last 72 hours.  Invalid input(s): FREET3   Coagulation profile No results for input(s): INR, PROTIME in the last 168 hours. ------------------------------------------------------------------------------------------------------------------- No results for input(s): DDIMER in the last 72  hours. -------------------------------------------------------------------------------------------------------------------  Cardiac Enzymes  Recent Labs Lab 09/23/14 0855  TROPONINI 0.67*   ------------------------------------------------------------------------------------------------------------------ Invalid input(s): POCBNP  ---------------------------------------------------------------------------------------------------------------  Urinalysis    Component Value Date/Time   COLORURINE YELLOW* 09/23/2014 0855   APPEARANCEUR CLEAR* 09/23/2014 0855   LABSPEC 1.022 09/23/2014 0855   PHURINE 5.0 09/23/2014 0855   GLUCOSEU NEGATIVE 09/23/2014 0855   HGBUR NEGATIVE 09/23/2014 0855   BILIRUBINUR NEGATIVE 09/23/2014 0855   KETONESUR NEGATIVE 09/23/2014 0855   PROTEINUR >500* 09/23/2014 0855   NITRITE NEGATIVE 09/23/2014 0855   LEUKOCYTESUR NEGATIVE 09/23/2014 0855     RADIOLOGY: Dg Chest Portable 1 View  09/23/2014   CLINICAL DATA:  Respiratory distress. Difficulty breathing. Initial encounter.  EXAM: PORTABLE CHEST - 1 VIEW  COMPARISON:  None.  FINDINGS: The cardiopericardial silhouette is enlarged. Severe background emphysema is present. There is blunting of the RIGHT costophrenic angle compatible with a small pleural effusion. LEFT perihilar airspace disease is present. There is also RIGHT upper lobe airspace disease. Underlying pulmonary parenchymal scarring is present, most severe at the RIGHT lung base. Tortuous aorta with atherosclerotic calcification.  IMPRESSION: 1. Cardiomegaly with LEFT perihilar airspace disease which may represent asymmetric pulmonary edema or pneumonia. 2. RIGHT upper lobe airspace opacity with similar differential considerations. 3. Severe emphysema. 4. Small RIGHT  pleural effusion. 5. Underlying pulmonary parenchymal scarring. Although difficult to evaluate based on other superimposed findings, pulmonary fibrosis is suspected.   Electronically Signed    By: Andreas Newport M.D.   On: 09/23/2014 09:21    EKG: No orders found for this or any previous visit.  IMPRESSION AND PLAN: Patient is a 81 year old white female brought in with some altered mental status noted to have bilateral pneumonia   1. Acute encephalopathy due to pneumonia: We'll treat with IV Levaquin will also obtain a swallow eval to make sure patient is not aspirating keep her nothing by mouth except meds for now  2. Elevated creatinine: Likely due to acute renal failure unknown baseline, unable to pull any previous records for unclear reason  3. Elevated troponin: Possibly due to demand ischemia follow troponin level echocardiogram of the heart cardiology consult  4. Hypertension we'll continue her on Norvasc and Coreg hold her home Cozaar  5. Elevated potassium monitor potassium level  6. History of TIA: Continue aspirin  7. CODE STATUS: Discussed with son [name] at 89 9-2 1 9-7 248 he is agreeable to a DO NOT RESUSCITATE status he does not want his mother did resuscitated. If she fails to improve would consider palliative care consult     All the records are reviewed and case discussed with ED provider. Management plans discussed with the patient, family and they are in agreement.  CODE STATUS:    Code Status Orders        Start     Ordered   09/23/14 1040  Do not attempt resuscitation (DNR)   Continuous    Question Answer Comment  In the event of cardiac or respiratory ARREST Do not call a "code blue"   In the event of cardiac or respiratory ARREST Do not perform Intubation, CPR, defibrillation or ACLS   In the event of cardiac or respiratory ARREST Use medication by any route, position, wound care, and other measures to relive pain and suffering. May use oxygen, suction and manual treatment of airway obstruction as needed for comfort.      09/23/14 1040    Advance Directive Documentation        Most Recent Value   Type of Advance Directive  Out of  facility DNR (pink MOST or yellow form)   Pre-existing out of facility DNR order (yellow form or pink MOST form)     "MOST" Form in Place?         TOTAL TIME TAKING CARE OF THIS PATIENT: 55 minutes.    Auburn Bilberry M.D on 09/23/2014 at 10:47 AM  Between 7am to 6pm - Pager - [phone]  After 6pm go to [email or web address] - password [email or web address]  [facility]  Office  [phone]  CC: Primary care physician; Pcp Not In System

## 2014-09-23 NOTE — Care Management (Signed)
Patient presents from home.  She has round that clock caregivers in the home.  She is never left alone.  At baseline, she is able ot ambulate at home with minimal supervision with a walker.  She assists with her bath and is able to feed herself.   Does not have chronic home 02. [facility] [phone]. Patient's son Orvilla Fusommy is her HCPOA [place]

## 2014-09-23 NOTE — ED Notes (Signed)
Attempted to call report to floor, secretary stated pts room was being clean and they would notify when room ready

## 2014-09-23 NOTE — ED Provider Notes (Signed)
[facility] Emergency Department Provider Note   ____________________________________________  Time seen: 0900  I have reviewed the triage vital signs and the nursing notes.   HISTORY  Chief Complaint No chief complaint on file.   History limited by: Altered Mental Status history obtained from caregiver    HPI Jill Clayton is a 81 y.o. female brought to the emergency department via EMS today because of difficulty breathing. Caregiver states that yesterday the patient was just a little more tired than normal however did not notice any other concerning symptoms. This morning when she went to check on her shewas having shallow breathing and difficulty breathing. The patient also was altered and less responsive to the caregiver. The patient is normally able to get up and walk with a walker and converse. The caregiver did not think that the patient was in any pain this morning. Did not notice any fevers this morning.She has however noticed a foul odor to her urine for the past week and states that she did go see her doctor which did not show any urinary tract infection.     No past medical history on file.  There are no active problems to display for this patient.   No past surgical history on file.  No current outpatient prescriptions on file.  Allergies Review of patient's allergies indicates not on file.  No family history on file.  Social History History  Substance Use Topics  . Smoking status: Never Smoker   . Smokeless tobacco: Not on file  . Alcohol Use: Not on file    Review of Systems Unable to obtain secondary to altered mental status  ____________________________________________   PHYSICAL EXAM:  VITAL SIGNS:   126  18  107/65 mmHg  92 %    Constitutional: Awake and alert. Eyes: Conjunctivae are normal. PERRL. Normal extraocular movements. ENT   Head: Normocephalic and atraumatic.   Nose: No congestion/rhinnorhea.    Mouth/Throat: Mucous membranes are moist.   Neck: No stridor. Hematological/Lymphatic/Immunilogical: No cervical lymphadenopathy. Cardiovascular: Tachycardia regular rhythm.  No murmurs, rubs, or gallops. Respiratory: Minimally increased respiratory effort. Scattered rhonchi. Gastrointestinal: Soft and nontender. No distention.  Genitourinary: Deferred Musculoskeletal: Normal range of motion in all extremities. No joint effusions.  No lower extremity tenderness nor edema. Neurologic:  Awake and alert, can not tell me her name and is not oriented. Skin:  Skin is warm, dry and intact. No rash noted.   ____________________________________________    LABS (pertinent positives/negatives)  Labs Reviewed  CBC WITH DIFFERENTIAL/PLATELET - Abnormal; Notable for the following:    WBC 22.0 (*)    RBC 2.95 (*)    Hemoglobin 10.0 (*)    HCT 30.8 (*)    MCV 104.4 (*)    Neutro Abs 19.8 (*)    Lymphs Abs 0.5 (*)    Monocytes Absolute 1.7 (*)    All other components within normal limits  BASIC METABOLIC PANEL - Abnormal; Notable for the following:    Potassium 5.4 (*)    CO2 19 (*)    Glucose, Bld 105 (*)    BUN 47 (*)    Creatinine, Ser 4.15 (*)    Calcium 8.3 (*)    GFR calc non Af Amer 8 (*)    GFR calc Af Amer 10 (*)    All other components within normal limits  URINALYSIS COMPLETEWITH MICROSCOPIC ([facility] ONLY) - Abnormal; Notable for the following:    Color, Urine YELLOW (*)    APPearance CLEAR (*)  Protein, ur >500 (*)    Bacteria, UA RARE (*)    Squamous Epithelial / LPF 0-5 (*)    All other components within normal limits  TROPONIN I - Abnormal; Notable for the following:    Troponin I 0.67 (*)    All other components within normal limits  LACTIC ACID, PLASMA - Abnormal; Notable for the following:    Lactic Acid, Venous 2.9 (*)    All other components within normal limits  CULTURE, BLOOD (ROUTINE X 2)  CULTURE, BLOOD (ROUTINE X 2)  LACTIC ACID, PLASMA      ____________________________________________   EKG  I, Phineas SemenGraydon Glennice Marcos, attending physician, personally viewed and interpreted this EKG  EKG Time: 0841 Rate: 121 Rhythm: Sinus tachycardia Axis: Left axis deviation Intervals: qtc 499 QRS: Left bundle branch block ST changes: No ST elevation equivalent ____________________________________________    RADIOLOGY  Chest x-ray IMPRESSION: 1. Cardiomegaly with LEFT perihilar airspace disease which may represent asymmetric pulmonary edema or pneumonia. 2. RIGHT upper lobe airspace opacity with similar differential considerations. 3. Severe emphysema. 4. Small RIGHT pleural effusion. 5. Underlying pulmonary parenchymal scarring. Although difficult to evaluate based on other superimposed findings, pulmonary fibrosis is suspected. ____________________________________________   PROCEDURES  Procedure(s) performed: None  Critical Care performed: Yes, see critical care note(s)  CRITICAL CARE Performed by: Phineas SemenGOODMAN, Elva Mauro   Total critical care time: 35  Critical care time was exclusive of separately billable procedures and treating other patients.  Critical care was necessary to treat or prevent imminent or life-threatening deterioration.  Critical care was time spent personally by me on the following activities: development of treatment plan with patient and/or surrogate as well as nursing, discussions with consultants, evaluation of patient's response to treatment, examination of patient, obtaining history from patient or surrogate, ordering and performing treatments and interventions, ordering and review of laboratory studies, ordering and review of radiographic studies, pulse oximetry and re-evaluation of patient's condition.  ____________________________________________   INITIAL IMPRESSION / ASSESSMENT AND PLAN / ED COURSE  Pertinent labs & imaging results that were available during my care of the patient were  reviewed by me and considered in my medical decision making (see chart for details).  Patient presents to the emergency department today with altered mental status. Patient noted be quite tachycardic on exam. Afebrile and with normal blood pressure. However I did have concern for infection. Blood work is consistent with infection, sepsis and multisystem organ failure. Troponin, creatinine, white blood cell, lactic acid all elevated. Patient started on multiple broad-spectrum antibiotics.  I had a long discussion with the granddaughter and reduce the idea that family should discuss goals of care. Will plan on admission to hospital for further management.  ____________________________________________   FINAL CLINICAL IMPRESSION(S) / ED DIAGNOSES  Final diagnoses:  Community acquired pneumonia  Sepsis, due to unspecified organism  Multisystem organ failure     Phineas SemenGraydon Haelie Clapp, MD 09/23/14 1043

## 2014-09-24 LAB — CBC
HCT: 28.6 % — ABNORMAL LOW (ref 35.0–47.0)
HEMOGLOBIN: 9.5 g/dL — AB (ref 12.0–16.0)
MCH: 34.6 pg — AB (ref 26.0–34.0)
MCHC: 33.3 g/dL (ref 32.0–36.0)
MCV: 103.9 fL — AB (ref 80.0–100.0)
PLATELETS: 245 10*3/uL (ref 150–440)
RBC: 2.75 MIL/uL — ABNORMAL LOW (ref 3.80–5.20)
RDW: 13.3 % (ref 11.5–14.5)
WBC: 18.8 10*3/uL — AB (ref 3.6–11.0)

## 2014-09-24 LAB — BASIC METABOLIC PANEL
ANION GAP: 13 (ref 5–15)
Anion gap: 13 (ref 5–15)
BUN: 55 mg/dL — ABNORMAL HIGH (ref 6–20)
BUN: 59 mg/dL — ABNORMAL HIGH (ref 6–20)
CALCIUM: 8.4 mg/dL — AB (ref 8.9–10.3)
CALCIUM: 8.4 mg/dL — AB (ref 8.9–10.3)
CO2: 19 mmol/L — ABNORMAL LOW (ref 22–32)
CO2: 19 mmol/L — ABNORMAL LOW (ref 22–32)
Chloride: 111 mmol/L (ref 101–111)
Chloride: 111 mmol/L (ref 101–111)
Creatinine, Ser: 4.11 mg/dL — ABNORMAL HIGH (ref 0.44–1.00)
Creatinine, Ser: 4.56 mg/dL — ABNORMAL HIGH (ref 0.44–1.00)
GFR calc Af Amer: 9 mL/min — ABNORMAL LOW (ref >60)
GFR calc non Af Amer: 8 mL/min — ABNORMAL LOW (ref >60)
GFR, EST AFRICAN AMERICAN: 10 mL/min — AB (ref >60)
GFR, EST NON AFRICAN AMERICAN: 9 mL/min — AB (ref >60)
Glucose, Bld: 114 mg/dL — ABNORMAL HIGH (ref 65–99)
Glucose, Bld: 116 mg/dL — ABNORMAL HIGH (ref 65–99)
POTASSIUM: 5.8 mmol/L — AB (ref 3.5–5.1)
Potassium: 6 mmol/L — ABNORMAL HIGH (ref 3.5–5.1)
SODIUM: 143 mmol/L (ref 135–145)
Sodium: 143 mmol/L (ref 135–145)

## 2014-09-24 LAB — POTASSIUM: Potassium: 5.6 mmol/L — ABNORMAL HIGH (ref 3.5–5.1)

## 2014-09-24 MED ORDER — INSULIN REGULAR HUMAN 100 UNIT/ML IJ SOLN
5.0000 [IU] | Freq: Once | INTRAMUSCULAR | Status: AC
  Administered 2014-09-24: 5 [IU] via INTRAVENOUS
  Filled 2014-09-24: qty 0.05

## 2014-09-24 MED ORDER — LORAZEPAM 2 MG/ML IJ SOLN
1.0000 mg | Freq: Once | INTRAMUSCULAR | Status: AC
  Administered 2014-09-24: 1 mg via INTRAVENOUS
  Filled 2014-09-24: qty 1

## 2014-09-24 MED ORDER — IPRATROPIUM-ALBUTEROL 0.5-2.5 (3) MG/3ML IN SOLN
3.0000 mL | RESPIRATORY_TRACT | Status: DC
  Administered 2014-09-24 – 2014-09-25 (×7): 3 mL via RESPIRATORY_TRACT
  Filled 2014-09-24 (×7): qty 3

## 2014-09-24 MED ORDER — SODIUM BICARBONATE 8.4 % IV SOLN
50.0000 meq | Freq: Once | INTRAVENOUS | Status: AC
  Administered 2014-09-24: 50 meq via INTRAVENOUS
  Filled 2014-09-24: qty 50

## 2014-09-24 MED ORDER — SODIUM CHLORIDE 0.9 % IV SOLN
1.0000 g | Freq: Once | INTRAVENOUS | Status: AC
  Administered 2014-09-24: 1 g via INTRAVENOUS
  Filled 2014-09-24: qty 10

## 2014-09-24 MED ORDER — DEXTROSE 50 % IV SOLN
25.0000 mL | Freq: Once | INTRAVENOUS | Status: AC
  Administered 2014-09-24: 25 mL via INTRAVENOUS
  Filled 2014-09-24: qty 50

## 2014-09-24 MED ORDER — METOPROLOL TARTRATE 1 MG/ML IV SOLN
2.5000 mg | Freq: Four times a day (QID) | INTRAVENOUS | Status: DC | PRN
  Administered 2014-09-24 – 2014-09-25 (×3): 2.5 mg via INTRAVENOUS
  Filled 2014-09-24 (×3): qty 5

## 2014-09-24 MED ORDER — SODIUM POLYSTYRENE SULFONATE 15 GM/60ML PO SUSP
30.0000 g | Freq: Once | ORAL | Status: AC
  Administered 2014-09-24: 30 g via RECTAL
  Filled 2014-09-24: qty 120

## 2014-09-24 MED ORDER — METHYLPREDNISOLONE SODIUM SUCC 125 MG IJ SOLR
60.0000 mg | INTRAMUSCULAR | Status: DC
  Administered 2014-09-24 – 2014-09-25 (×2): 60 mg via INTRAVENOUS
  Filled 2014-09-24 (×2): qty 2

## 2014-09-24 NOTE — Progress Notes (Signed)
Patient remains lethargic and confused, received meds to lower potassium today, recheck of serum potassium pending.   Unable to tolerate PO, MD aware.  Patient currently resting in no apparent distress, private care provider at bedside.  See flowsheets for further details.

## 2014-09-24 NOTE — Progress Notes (Signed)
At start of shift, patient had audible crackles and low o2 saturation. MD notified and Lasix20mg  given along with d/c of IVF. Bipap initiated at 100% FIO2. Bipap titrated down overnight to 50%. Patient lethargic with incomprehensible speech. Diminished lungs at this time. VSS. ST. See charting for assessments.

## 2014-09-24 NOTE — Progress Notes (Signed)
RN notified Dr Luberta MutterKonidena that patient's has not yet received treatment due to lack of IV access, question timed potassium serum.  MD states to d/c current serum potassium and to order timed potassium serum 2 hrs after receiving treatment.

## 2014-09-24 NOTE — Progress Notes (Signed)
Dr Luberta MutterKonidena notified of patient unable to tolerate PO due to lethargy and resp status, patient is NPO but has PO meds order.  Patient HR is elevated in 120s and hypertensive.  MD states "don't give PO meds"  MD gave order for IV metoprolol PRN for HR.

## 2014-09-24 NOTE — Progress Notes (Signed)
Initial Nutrition Assessment     INTERVENTION:   Medical Food Supplement Therapy: recommend addition of nutritional supplement once diet advanced   NUTRITION DIAGNOSIS:   Inadequate oral intake related to lethargy/confusion as evidenced by NPO status.    GOAL:   Patient will meet greater than or equal to 90% of their needs   MONITOR:    (Energy Intake, Anthropometrics, Digestive System, Electrolyte/Renal Profile, Glucose Profile)  REASON FOR ASSESSMENT:   Malnutrition Screening Tool    ASSESSMENT:   Pt admitted with SOB, AMS, weakness; on nonrebreather, personal sitter met writer at door this afternoon, asked that writer return at another time as they had just gotten pt settled down  Past Medical History  Diagnosis Date  . Anemia   . Chronic back pain   . Glaucoma   . Fibrocystic breast disease   . HTN (hypertension)   . TIA (transient ischemic attack)   . CAD (coronary artery disease)   . GERD (gastroesophageal reflux disease)   . B12 deficiency   . Chronic back pain   . Glaucoma   . H/O thyroid nodule   . Environmental allergies      Diet Order:  Diet NPO time specified, SLP consulted, pt not alert enough for diet advancement at present   Current Nutrition: NPO  Food/Nutrition-Related History: unable to assess   Medications: lasix, solumedrol, novolin  Electrolyte/Renal Profile and Glucose Profile:   Recent Labs Lab 09/23/14 0855 09/24/14 0345 09/24/14 1113  NA 140 143 143  K 5.4* 5.8* 6.0*  CL 109 111 111  CO2 19* 19* 19*  BUN 47* 55* 59*  CREATININE 4.15* 4.11* 4.56*  CALCIUM 8.3* 8.4* 8.4*  GLUCOSE 105* 114* 116*   Protein Profile: No results for input(s): ALBUMIN in the last 168 hours.   Nutrition-Focused Physical Exam Findings:  Unable to complete Nutrition-Focused physical exam at this time.    Weight Change: unable to assess   Skin:  Reviewed, no issues  Last BM:    unknown  Height:   Ht Readings from Last 1  Encounters:  09/23/14 $RemoveB'5\' 4"'gHYBYUWv$  (1.626 m)    Weight:   Wt Readings from Last 1 Encounters:  09/23/14 99 lb 8 oz (45.133 kg)    Ideal Body Weight:     Wt Readings from Last 10 Encounters:  09/23/14 99 lb 8 oz (45.133 kg)    BMI:  Body mass index is 17.07 kg/(m^2).  Estimated Nutritional Needs:   Kcal:  7076-1518 kcals (BEE 845, 1.3 AF, 1.1-1.3 IF)   Protein:  45-54 g (1.0-1.2 g/kg)   Fluid:  1125-1350 mL (25-30 ml/kg)   EDUCATION NEEDS:   Education needs no appropriate at this time  [place], Olga, LDN [phone] Pager

## 2014-09-24 NOTE — Progress Notes (Addendum)
[facility] - [place] [facility]   PATIENT NAME: Jill Clayton    MR#:  [id number]  DATE OF BIRTH:  1919/07/25  SUBJECTIVE: Brought in by the family secondary to shortness of breath, lethargic. Found to have pneumonia. Patient is a unable to give any history. History is obtained from caregiver at bedside. Has been having lethargic since Saturday. Found to have hypoxic respiratory failure secondary to pneumonia, renal failure. She is now 100% nonrebreather sats 98%. Afebrile.   CHIEF COMPLAINT:   Chief Complaint  Patient presents with  . Weakness  . Shortness of Breath    REVIEW OF SYSTEMS:    ROS  Unable to obtain due to lethargy. Tolerating Diet:no Tolerating PT: no     DRUG ALLERGIES:  Not on File  VITALS:  Blood pressure 159/83, pulse 124, temperature 97.5 F (36.4 C), temperature source Oral, resp. rate 24, height 5\' 4"  (1.626 m), weight 45.133 kg (99 lb 8 oz), SpO2 98 %.  PHYSICAL EXAMINATION:   Physical Exam  GENERAL:  81 y.o.-year-old patient lying in the bed with respiratory distress EYES: Pupils equal, round, reactive to light and accommodation. No scleral icterus. Extraocular muscles intact.  HEENT: Head atraumatic, normocephalic. Oropharynx and nasopharynx clear.  NECK:  Supple, no jugular venous distention. No thyroid enlargement, no tenderness.  LUNGS coarse breath sounds bilaterally, use of accessory muscles of respiration.  CARDIOVASCULAR: S1, S2 normal. No murmurs, rubs, or gallops.  ABDOMEN: Soft, nontender, nondistended. Bowel sounds present. No organomegaly or mass.  EXTREMITIES: No pedal edema, cyanosis, or clubbing.  NEUROLOGIC: Cranial nerves II through XII are intact. Muscle strength 5/5 in all extremities. Sensation intact. Gait not checked.  PSYCHIATRIC: The patient is alert and oriented x 3.  SKIN: No obvious rash, lesion, or ulcer.    LABORATORY PANEL:   CBC  Recent Labs Lab 09/24/14 0345  WBC 18.8*  HGB  9.5*  HCT 28.6*  PLT 245   ------------------------------------------------------------------------------------------------------------------  Chemistries   Recent Labs Lab 09/24/14 0345  NA 143  K 5.8*  CL 111  CO2 19*  GLUCOSE 114*  BUN 55*  CREATININE 4.11*  CALCIUM 8.4*   ------------------------------------------------------------------------------------------------------------------  Cardiac Enzymes  Recent Labs Lab 09/23/14 2028  TROPONINI 1.19*   ------------------------------------------------------------------------------------------------------------------  RADIOLOGY:  Dg Chest Portable 1 View  09/23/2014   CLINICAL DATA:  Respiratory distress. Difficulty breathing. Initial encounter.  EXAM: PORTABLE CHEST - 1 VIEW  COMPARISON:  None.  FINDINGS: The cardiopericardial silhouette is enlarged. Severe background emphysema is present. There is blunting of the RIGHT costophrenic angle compatible with a small pleural effusion. LEFT perihilar airspace disease is present. There is also RIGHT upper lobe airspace disease. Underlying pulmonary parenchymal scarring is present, most severe at the RIGHT lung base. Tortuous aorta with atherosclerotic calcification.  IMPRESSION: 1. Cardiomegaly with LEFT perihilar airspace disease which may represent asymmetric pulmonary edema or pneumonia. 2. RIGHT upper lobe airspace opacity with similar differential considerations. 3. Severe emphysema. 4. Small RIGHT pleural effusion. 5. Underlying pulmonary parenchymal scarring. Although difficult to evaluate based on other superimposed findings, pulmonary fibrosis is suspected.   Electronically Signed   By: Andreas NewportGeoffrey  Lamke M.D.   On: 09/23/2014 09:21     ASSESSMENT AND PLAN:   Active Problems:   Pneumonia   #1: Acute hypoxic respiratory failure secondary to pneumonia: Continue oxygen to keep sats more than 95%. continue vancomycin and  Zosyn. Critically ill l with acute respiratory failure.  Advanced age and she is a DO NOT  RESUSCITATE at this time. Continue nebulizers. Add IV Solu-Medrol. 2. Acute renal failure secondary to sepsis with the pneumonia. With ATN Continue fluids, a pain Foley catheter to monitor urine output was up to obtain nephrology consult and order a renal sonogram.   stop  Potassium sparing diuretics. hold the Lasix and losartan. Received fluid boluses. Hyperkalemia; likely secondary to renal failure, losartan. Recheck the potassium today.  #4: Elevated troponin secondary to demand ischemia and renal failure and respiratory failure seen Dr. Lady Gary and the following echocardiogram.  Prognosis guarded patient is at high risk for cardiopulmonary pulmonary arrest secondary to advanced age 81 and respiratory failure and renal failure. CODE STATUS DO NOT RESUSCITATE.   Hold the by mouth medications at this time secondary to lethargy And speech pathology evaluation when she becomes more alert.  All the records are reviewed and case discussed with Care Management/Social Workerr. Management plans discussed with the patient, family and they are in agreement.  CODE STATUS: DO NOT RESUSCITATE  TOTAL TIME TAKING CARE OF THIS PATIENT: 35  minutes minutes.   Not on vanco zosyn.  Katha Hamming M.D on 09/24/2014 at 10:37 AM  Between 7am to 6pm - Pager - [phone]  After 6pm go to [email or web address] - password [email or web address]  [facility]  Office  [phone]  CC: Primary care physician; Pcp Not In System

## 2014-09-24 NOTE — Consult Note (Signed)
[facility]  CARDIOLOGY CONSULT NOTE  Patient ID: Jill LighterHazel Clayton MRN: [id number] DOB/AGE: 1919-10-14 81 y.o.  Admit date: 09/23/2014 Referring Physician   Primary Physician   Primary Cardiologist   Reason for Consultation Abnormal troponin  HPI:    ROS  Past Medical History  Diagnosis Date  . Anemia   . Chronic back pain   . Glaucoma   . Fibrocystic breast disease   . HTN (hypertension)   . TIA (transient ischemic attack)   . CAD (coronary artery disease)   . GERD (gastroesophageal reflux disease)   . B12 deficiency   . Chronic back pain   . Glaucoma   . H/O thyroid nodule   . Environmental allergies     Family History  Problem Relation Age of Onset  . Hypertension      History   Social History  . Marital Status: Single    Spouse Name: N/A  . Number of Children: N/A  . Years of Education: N/A   Occupational History  . Not on file.   Social History Main Topics  . Smoking status: Never Smoker   . Smokeless tobacco: Not on file  . Alcohol Use: No  . Drug Use: No  . Sexual Activity: Not on file   Other Topics Concern  . Not on file   Social History Narrative  . No narrative on file    Past Surgical History  Procedure Laterality Date  . Breast biopsy    . Basal cell cancer    . Cataract extraction    . Renal a stenosis       Prescriptions prior to admission  Medication Sig Dispense Refill Last Dose  . acetaminophen (TYLENOL) 500 MG tablet Take 1,000 mg by mouth 2 (two) times daily.   09/22/2014 at Unknown time  . amLODipine (NORVASC) 5 MG tablet Take 1 tablet by mouth daily.  2 09/22/2014 at Unknown time  . aspirin 81 MG tablet Take 81 mg by mouth daily.   09/22/2014 at Unknown time  . buPROPion (WELLBUTRIN SR) 100 MG 12 hr tablet Take 1 tablet by mouth 2 (two) times daily.  3 09/22/2014 at Unknown time  . carvedilol (COREG) 12.5 MG tablet Take 1 tablet by mouth 2 (two) times daily.  5 09/22/2014 at Unknown time  .  dipyridamole (PERSANTINE) 75 MG tablet Take 1 tablet by mouth 2 (two) times daily.  5 09/22/2014 at Unknown time  . ferrous sulfate 325 (65 FE) MG tablet Take 325 mg by mouth 2 (two) times daily.  3 09/22/2014 at Unknown time  . isosorbide mononitrate (IMDUR) 60 MG 24 hr tablet Take 1 tablet by mouth daily.  4 09/22/2014 at Unknown time  . losartan (COZAAR) 100 MG tablet Take 1 tablet by mouth every morning.  3 09/22/2014 at Unknown time  . Multiple Vitamin (MULTIVITAMIN) capsule Take 1 capsule by mouth daily.   09/22/2014 at Unknown time  . OLANZapine (ZYPREXA) 2.5 MG tablet Take 1 tablet by mouth every evening.  1 09/22/2014 at Unknown time  . omeprazole (PRILOSEC) 20 MG capsule Take 1 capsule by mouth every morning.  10 09/22/2014 at Unknown time  . vitamin C (ASCORBIC ACID) 500 MG tablet Take 500 mg by mouth daily.   09/22/2014 at Unknown time    Physical Exam: Blood pressure 159/83, pulse 124, temperature 97.5 F (36.4 C), temperature source Oral, resp. rate 24, height 5\' 4"  (1.626 m), weight 45.133 kg (99 lb 8 oz),  SpO2 98 %.    General appearance: uncooperative Resp: dullness to percussion bibasilar Cardio: tachycardic GI: soft, non-tender; bowel sounds normal; no masses,  no organomegaly Extremities: extremities normal, atraumatic, no cyanosis or edema Pulses: 2+ and symmetric Neurologic: Grossly normal Labs:   Lab Results  Component Value Date   WBC 18.8* 09/24/2014   HGB 9.5* 09/24/2014   HCT 28.6* 09/24/2014   MCV 103.9* 09/24/2014   PLT 245 09/24/2014    Recent Labs Lab 09/24/14 0345  NA 143  K 5.8*  CL 111  CO2 19*  BUN 55*  CREATININE 4.11*  CALCIUM 8.4*  GLUCOSE 114*   Lab Results  Component Value Date   TROPONINI 1.19* 09/23/2014      Radiology: rll infiltrate EKG: sinus tachycardia  ASSESSMENT AND PLAN: Pt with mulitiple medical problems including dementia, acute on chronic renal failure, pneumonia with mildly elevataed troponin. Does not appear to be acs.  Not candidate for invasive evaluation. Will review echo when available. Medical manamgent. Continue to treat acute renal failure Signed: Dalia Heading MD, [facility] 09/24/2014, 9:27 AM

## 2014-09-24 NOTE — Progress Notes (Signed)
Speech Therapy Note: attempted to see pt 2x today after receiving a BSE request. Upon review of chart notes, pt is too lethargic to be appropriate for BSE today. Pt remains NPO. Will f/u w/ pt's status tomorrow. NSG agreed.

## 2014-09-25 LAB — CBC
HCT: 28.2 % — ABNORMAL LOW (ref 35.0–47.0)
HEMOGLOBIN: 9.3 g/dL — AB (ref 12.0–16.0)
MCH: 34 pg (ref 26.0–34.0)
MCHC: 32.9 g/dL (ref 32.0–36.0)
MCV: 103.1 fL — ABNORMAL HIGH (ref 80.0–100.0)
Platelets: 273 10*3/uL (ref 150–440)
RBC: 2.73 MIL/uL — ABNORMAL LOW (ref 3.80–5.20)
RDW: 13.5 % (ref 11.5–14.5)
WBC: 13 10*3/uL — ABNORMAL HIGH (ref 3.6–11.0)

## 2014-09-25 LAB — BASIC METABOLIC PANEL
Anion gap: 17 — ABNORMAL HIGH (ref 5–15)
BUN: 79 mg/dL — ABNORMAL HIGH (ref 6–20)
CALCIUM: 8.4 mg/dL — AB (ref 8.9–10.3)
CHLORIDE: 114 mmol/L — AB (ref 101–111)
CO2: 17 mmol/L — AB (ref 22–32)
CREATININE: 5.24 mg/dL — AB (ref 0.44–1.00)
GFR calc non Af Amer: 6 mL/min — ABNORMAL LOW (ref >60)
GFR, EST AFRICAN AMERICAN: 7 mL/min — AB (ref >60)
GLUCOSE: 139 mg/dL — AB (ref 65–99)
Potassium: 6.1 mmol/L — ABNORMAL HIGH (ref 3.5–5.1)
Sodium: 148 mmol/L — ABNORMAL HIGH (ref 135–145)

## 2014-09-25 LAB — POTASSIUM: Potassium: 5.4 mmol/L — ABNORMAL HIGH (ref 3.5–5.1)

## 2014-09-25 MED ORDER — SODIUM BICARBONATE 8.4 % IV SOLN: 50.0000 meq | Freq: Once | INTRAVENOUS | Status: DC

## 2014-09-25 MED ORDER — SODIUM CHLORIDE 0.9 % IV SOLN
1.0000 g | Freq: Once | INTRAVENOUS | Status: AC
  Administered 2014-09-25: 1 g via INTRAVENOUS
  Filled 2014-09-25: qty 10

## 2014-09-25 MED ORDER — ZIPRASIDONE MESYLATE 20 MG IM SOLR
20.0000 mg | Freq: Two times a day (BID) | INTRAMUSCULAR | Status: DC | PRN
  Filled 2014-09-25: qty 20

## 2014-09-25 MED ORDER — DEXTROSE 50 % IV SOLN: 25.0000 mL | Freq: Once | INTRAVENOUS | Status: DC

## 2014-09-25 MED ORDER — SODIUM BICARBONATE 8.4 % IV SOLN
50.0000 meq | Freq: Once | INTRAVENOUS | Status: AC
  Administered 2014-09-25: 50 meq via INTRAVENOUS
  Filled 2014-09-25: qty 50

## 2014-09-25 MED ORDER — INSULIN REGULAR HUMAN 100 UNIT/ML IJ SOLN
5.0000 [IU] | Freq: Once | INTRAMUSCULAR | Status: AC
  Administered 2014-09-25: 5 [IU] via INTRAVENOUS
  Filled 2014-09-25: qty 0.05

## 2014-09-25 MED ORDER — DEXTROSE 50 % IV SOLN
INTRAVENOUS | Status: AC
  Filled 2014-09-25: qty 50

## 2014-09-25 MED ORDER — SODIUM POLYSTYRENE SULFONATE 15 GM/60ML PO SUSP
30.0000 g | Freq: Once | ORAL | Status: AC
  Administered 2014-09-25: 30 g via RECTAL
  Filled 2014-09-25: qty 120

## 2014-09-25 MED ORDER — MORPHINE SULFATE 2 MG/ML IJ SOLN
1.0000 mg | INTRAMUSCULAR | Status: DC | PRN
  Administered 2014-09-25 (×2): 1 mg via INTRAVENOUS
  Administered 2014-09-25: 2 mg via INTRAVENOUS
  Filled 2014-09-25 (×3): qty 1

## 2014-09-25 MED ORDER — SODIUM CHLORIDE 0.9 % IV SOLN: 1.0000 g | Freq: Once | INTRAVENOUS | Status: DC

## 2014-09-25 MED ORDER — DEXTROSE 50 % IV SOLN
25.0000 mL | Freq: Once | INTRAVENOUS | Status: AC
  Administered 2014-09-25: 25 mL via INTRAVENOUS
  Filled 2014-09-25: qty 50

## 2014-09-25 MED ORDER — METOPROLOL TARTRATE 25 MG PO TABS: 25.0000 mg | ORAL_TABLET | Freq: Two times a day (BID) | ORAL | Status: DC

## 2014-09-25 MED ORDER — INSULIN REGULAR HUMAN 100 UNIT/ML IJ SOLN: 5.0000 [IU] | Freq: Once | INTRAMUSCULAR | Status: DC

## 2014-09-25 NOTE — Care Management (Signed)
Patient will transfer to Hospice Home.

## 2014-09-25 NOTE — Consult Note (Signed)
Reason for Consult:ARF, CKD Referring Physician: Dr Jill Clayton is an 81 y.o. female.  HPI: Patient is a 81 year old Caucasian female with many chronic medical problems including severe chronic kidney disease, frailty, chronic back pain, hypertension, TIA, coronary disease, GERD. Patient is normally cared for at home by a caregiver around the clock. Patient is not able to provide any meaningful history as she is close to the nontender. She was brought in from home for shortness of breath that has been building up for some days. Caregiver reports that on a good day patient wakes up for maybe 5-6 hours. She eats very littlel. She voids upto twice a day. But most of the time, she lays in her bed and sleeps. She can sleep up to every 22 hours sometimes.  Upon admission, patient is noted to have critically elevated BUN, creatinine and potassium. She was treated with Kayexalate. She was also given oxygen supplementation with NIPPV but the mask made her uncomfortable   Past Medical History  Diagnosis Date  . Anemia   . Chronic back pain   . Glaucoma   . Fibrocystic breast disease   . HTN (hypertension)   . TIA (transient ischemic attack)   . CAD (coronary artery disease)   . GERD (gastroesophageal reflux disease)   . B12 deficiency   . Chronic back pain   . Glaucoma   . H/O thyroid nodule   . Environmental allergies     Past Surgical History  Procedure Laterality Date  . Breast biopsy    . Basal cell cancer    . Cataract extraction    . Renal a stenosis      Family History  Problem Relation Age of Onset  . Hypertension      Social History:  reports that she has never smoked. She does not have any smokeless tobacco history on file. She reports that she does not drink alcohol or use illicit drugs.  Allergies: Not on File  Medications:  Prior to Admission:  Prescriptions prior to admission  Medication Sig Dispense Refill Last Dose  . acetaminophen (TYLENOL) 500 MG  tablet Take 1,000 mg by mouth 2 (two) times daily.   09/22/2014 at Unknown time  . amLODipine (NORVASC) 5 MG tablet Take 1 tablet by mouth daily.  2 09/22/2014 at Unknown time  . aspirin 81 MG tablet Take 81 mg by mouth daily.   09/22/2014 at Unknown time  . buPROPion (WELLBUTRIN SR) 100 MG 12 hr tablet Take 1 tablet by mouth 2 (two) times daily.  3 09/22/2014 at Unknown time  . carvedilol (COREG) 12.5 MG tablet Take 1 tablet by mouth 2 (two) times daily.  5 09/22/2014 at Unknown time  . dipyridamole (PERSANTINE) 75 MG tablet Take 1 tablet by mouth 2 (two) times daily.  5 09/22/2014 at Unknown time  . ferrous sulfate 325 (65 FE) MG tablet Take 325 mg by mouth 2 (two) times daily.  3 09/22/2014 at Unknown time  . isosorbide mononitrate (IMDUR) 60 MG 24 hr tablet Take 1 tablet by mouth daily.  4 09/22/2014 at Unknown time  . losartan (COZAAR) 100 MG tablet Take 1 tablet by mouth every morning.  3 09/22/2014 at Unknown time  . Multiple Vitamin (MULTIVITAMIN) capsule Take 1 capsule by mouth daily.   09/22/2014 at Unknown time  . OLANZapine (ZYPREXA) 2.5 MG tablet Take 1 tablet by mouth every evening.  1 09/22/2014 at Unknown time  . omeprazole (PRILOSEC) 20 MG capsule Take 1 capsule  by mouth every morning.  10 09/22/2014 at Unknown time  . vitamin C (ASCORBIC ACID) 500 MG tablet Take 500 mg by mouth daily.   09/22/2014 at Unknown time   Scheduled: . amLODipine  5 mg Oral Daily  . aspirin EC  81 mg Oral Daily  . carvedilol  12.5 mg Oral BID  . dipyridamole  75 mg Oral BID  . ferrous sulfate  325 mg Oral BID  . heparin  5,000 Units Subcutaneous 3 times per day  . ipratropium-albuterol  3 mL Nebulization Q4H  . isosorbide mononitrate  60 mg Oral Daily  . levofloxacin (LEVAQUIN) IV  500 mg Intravenous Q48H  . methylPREDNISolone (SOLU-MEDROL) injection  60 mg Intravenous Q24H  . pantoprazole  40 mg Oral Daily  . sodium chloride  3 mL Intravenous Q12H  . vitamin C  500 mg Oral Daily   Continuous:   Results  for orders placed or performed during the hospital encounter of 09/23/14 (from the past 48 hour(s))  Blood culture (routine x 2)     Status: None (Preliminary result)   Collection Time: 09/23/14  9:51 AM  Result Value Ref Range   Specimen Description BLOOD RIGHT WRIST    Special Requests BOTTLES DRAWN AEROBIC AND ANAEROBIC  1 CC    Culture  Setup Time      GRAM POSITIVE COCCI AEROBIC BOTTLE ONLY CRITICAL RESULT CALLED TO, READ BACK BY AND VERIFIED WITH: Jill Clayton @ [phone] [id number] CONFIRMED BY TSH    Culture      COAGULASE NEGATIVE STAPHYLOCOCCUS AEROBIC BOTTLE ONLY POSSIBLE CONTAMINATION WITH SKIN FLORA    Report Status PENDING   Blood gas, arterial     Status: Abnormal   Collection Time: 09/23/14  2:00 PM  Result Value Ref Range   FIO2 0.44 %   Delivery systems NO CHARGE    pH, Arterial 7.40 7.350 - 7.450   pCO2 arterial 33 32.0 - 48.0 mmHg   pO2, Arterial 59 (L) 83.0 - 108.0 mmHg   Bicarbonate 20.4 (L) 21.0 - 28.0 mEq/L   Acid-base deficit 3.6 (H) 0.0 - 2.0 mmol/L   O2 Saturation 90.2 %   Patient temperature 37.0    Collection site RIGHT RADIAL    Sample type ARTERIAL DRAW    Allens test (pass/fail) POSITIVE (A) PASS  Lactic acid, plasma     Status: None   Collection Time: 09/23/14  2:50 PM  Result Value Ref Range   Lactic Acid, Venous 1.5 0.5 - 2.0 mmol/L  Troponin I     Status: Abnormal   Collection Time: 09/23/14  2:50 PM  Result Value Ref Range   Troponin I 0.89 (H) <0.031 ng/mL    Comment: RESULTS PREVIOUSLY CALLED AT 0945  09/23/14 BY K[facility]...SDR        POSSIBLE MYOCARDIAL ISCHEMIA. SERIAL TESTING RECOMMENDED.   Troponin I     Status: Abnormal   Collection Time: 09/23/14  8:28 PM  Result Value Ref Range   Troponin I 1.19 (H) <0.031 ng/mL    Comment: RESULTS PREVIOUSLY CALLED 09/23/14 $RemoveBefore'@0945'[email or web address]$  BY [facility]...AJO        POSSIBLE MYOCARDIAL ISCHEMIA. SERIAL TESTING RECOMMENDED.   CBC     Status: Abnormal   Collection Time: 09/24/14  3:45 AM  Result Value  Ref Range   WBC 18.8 (H) 3.6 - 11.0 K/uL   RBC 2.75 (L) 3.80 - 5.20 MIL/uL   Hemoglobin 9.5 (L) 12.0 - 16.0 g/dL   HCT 28.6 (L) 35.0 -  47.0 %   MCV 103.9 (H) 80.0 - 100.0 fL   MCH 34.6 (H) 26.0 - 34.0 pg   MCHC 33.3 32.0 - 36.0 g/dL   RDW 13.3 11.5 - 14.5 %   Platelets 245 150 - 440 K/uL  Basic metabolic panel     Status: Abnormal   Collection Time: 09/24/14  3:45 AM  Result Value Ref Range   Sodium 143 135 - 145 mmol/L   Potassium 5.8 (H) 3.5 - 5.1 mmol/L   Chloride 111 101 - 111 mmol/L   CO2 19 (L) 22 - 32 mmol/L   Glucose, Bld 114 (H) 65 - 99 mg/dL   BUN 55 (H) 6 - 20 mg/dL   Creatinine, Ser 4.11 (H) 0.44 - 1.00 mg/dL   Calcium 8.4 (L) 8.9 - 10.3 mg/dL   GFR calc non Af Amer 9 (L) >60 mL/min   GFR calc Af Amer 10 (L) >60 mL/min    Comment: (NOTE) The eGFR has been calculated using the CKD EPI equation. This calculation has not been validated in all clinical situations. eGFR's persistently <60 mL/min signify possible Chronic Kidney Disease.    Anion gap 13 5 - 15  Basic metabolic panel     Status: Abnormal   Collection Time: 09/24/14 11:13 AM  Result Value Ref Range   Sodium 143 135 - 145 mmol/L   Potassium 6.0 (H) 3.5 - 5.1 mmol/L   Chloride 111 101 - 111 mmol/L   CO2 19 (L) 22 - 32 mmol/L   Glucose, Bld 116 (H) 65 - 99 mg/dL   BUN 59 (H) 6 - 20 mg/dL   Creatinine, Ser 4.56 (H) 0.44 - 1.00 mg/dL   Calcium 8.4 (L) 8.9 - 10.3 mg/dL   GFR calc non Af Amer 8 (L) >60 mL/min   GFR calc Af Amer 9 (L) >60 mL/min    Comment: (NOTE) The eGFR has been calculated using the CKD EPI equation. This calculation has not been validated in all clinical situations. eGFR's persistently <60 mL/min signify possible Chronic Kidney Disease.    Anion gap 13 5 - 15  Potassium     Status: Abnormal   Collection Time: 09/24/14  6:42 PM  Result Value Ref Range   Potassium 5.6 (H) 3.5 - 5.1 mmol/L  Basic metabolic panel     Status: Abnormal   Collection Time: 09/25/14  4:21 AM  Result  Value Ref Range   Sodium 148 (H) 135 - 145 mmol/L   Potassium 6.1 (H) 3.5 - 5.1 mmol/L   Chloride 114 (H) 101 - 111 mmol/L   CO2 17 (L) 22 - 32 mmol/L   Glucose, Bld 139 (H) 65 - 99 mg/dL   BUN 79 (H) 6 - 20 mg/dL   Creatinine, Ser 5.24 (H) 0.44 - 1.00 mg/dL   Calcium 8.4 (L) 8.9 - 10.3 mg/dL   GFR calc non Af Amer 6 (L) >60 mL/min   GFR calc Af Amer 7 (L) >60 mL/min    Comment: (NOTE) The eGFR has been calculated using the CKD EPI equation. This calculation has not been validated in all clinical situations. eGFR's persistently <60 mL/min signify possible Chronic Kidney Disease.    Anion gap 17 (H) 5 - 15  CBC     Status: Abnormal   Collection Time: 09/25/14  4:21 AM  Result Value Ref Range   WBC 13.0 (H) 3.6 - 11.0 K/uL   RBC 2.73 (L) 3.80 - 5.20 MIL/uL   Hemoglobin 9.3 (L) 12.0 -  16.0 g/dL   HCT 28.2 (L) 35.0 - 47.0 %   MCV 103.1 (H) 80.0 - 100.0 fL   MCH 34.0 26.0 - 34.0 pg   MCHC 32.9 32.0 - 36.0 g/dL   RDW 13.5 11.5 - 14.5 %   Platelets 273 150 - 440 K/uL  Potassium     Status: Abnormal   Collection Time: 09/25/14  8:56 AM  Result Value Ref Range   Potassium 5.4 (H) 3.5 - 5.1 mmol/L    No results found.  Review of Systems  Unable to perform ROS: mental acuity   Blood pressure 132/66, pulse 124, temperature 97.8 F (36.6 C), temperature source Oral, resp. rate 22, height 5' 4" (1.626 m), weight 45.133 kg (99 lb 8 oz), SpO2 97 %. Physical Exam  Constitutional:  Thin, frail,  HENT:  Head: Atraumatic.  Mouth/Throat: Oropharynx is clear and moist. No oropharyngeal exudate.  Eyes: No scleral icterus.  Neck: Neck supple. JVD present. No thyromegaly present.  Cardiovascular: Normal rate.  Exam reveals no friction rub.   Respiratory: No stridor. She is in respiratory distress. She has wheezes. She has rales.  GI: Soft. Bowel sounds are normal. She exhibits no distension. There is no tenderness. There is no guarding.  Musculoskeletal: She exhibits no edema or  tenderness.  Neurological:  Patient is not following any commands. She is not answering any questions. He continues to moan  Skin: Skin is warm and dry. No rash noted.    Assessment/Plan: 81 year old caucasian female with medical problems of hypertension, hyperlipidemia, CKD, complete occulusion right renal artery, left renal artery stenosis, history of right breast cancer s/p excision, osteoporosis, history of complex partial seizures, B12 deficiency, basal cell carcinoma admitted with syncopal episode.  1. CKD st 5 with uremia 2. Severe Hyperkalemia 3.  Bilateral renal artery stenosis: Past evaluations have revealed that she has chronically occluded right renal artery per prior aortogram, also has diseased left renal artery that wasn't felt to be amenable to intervention.   4. Pulm edema with resp distress  At this point, it appears that patient has severe uremia which has been ongoing for some time. Due to her chronic medical conditions and frailty, she will not be able to tolerate dialysis. I have discussed this with her son Mr. Jenne Campus. He is planning to get to the hospital later today along with his family. I have discussed with the caregiver who has been taking care of the patient for the past 3 years. She reports that patient's condition has declined recently. Some days Patient lays/sleep in the bed for up to 20 hours.  I have discussed with patient's son that keeping in mind her chronic medical problems and under current circumstances, it is best to keep patient pain free and comfortable. We should strongly consider hospice.  Jill Clayton 09/25/2014, 9:34 AM

## 2014-09-25 NOTE — Care Management (Signed)
Patient continues to decline and meets hospice criteria.  Servando SnareKaren R with [facility] is assessing patient for Hospice Home vs discharge home with hospice service.  Clydie BraunKaren is speaking with patient's sonTommy Georgeanne NimBarbour.  Orvilla Fusommy has given his permission for patient to receive morphine for comfort/pain

## 2014-09-25 NOTE — Progress Notes (Signed)
Patient given dose of morphine 2 mg iv prior to transport per mds request. Iv removed x2, telemetry was removed. Patient discharged on 02 at 6L. Moans and groans noted. Ems transported patient to hospice house , report was called by karen. Foley left intact per karen request.  Toys 'R' UsCrystal Monica Clayton

## 2014-09-25 NOTE — Progress Notes (Signed)
[facility]. Jill Clayton is a 81 year old woman [facility] for evaluation of weakness and dyspnea. She was found to have community acquired pneumonia and acute renal failure. She was treated with IV abt but continued to decline requiring bipap and nonrebreather mask,  now on 6 liters of oxygen via nasal cannula. Her PMH includes GERD, CAD, HTN, anemia, TIA and chronic back pain.  Patient seen lying in bed, eyes open, increased work of breathing noted, respiratory rate of 22-28, moaning. Per staff RN Jill Clayton patient's son had declined use of morphine.  Writer contacted patient's son Jill Clayton [phone]) to discuss hospice services and comfort care. Education provided regarding hospice home services, philosophy and team approach to care with understanding voiced. Writer also provided education regarding symptom management and the use of morphine to assist with air hunger and pain. He agreed to the use of IV morphine for symptom management. Writer relayed this Interior and spatial designerinformationstaff RN Jill Clayton and Attending physician Jill Clayton.  Staff RN Jill Clayton to administer as ordered.  Patient remained with increased work of breathing and continued moaning after 1 mg of morphine, dose repeated as ordered and was minimally effective. Patient's caregiver Jill Clayton present in patient's room aware of plan. All information faxed to referral intake. [facility] to [facility] [place] to the hospital. He will fax consents to the Hospice Home. Report called to the hospice home, EMS notified for pick up. RN, CM, MD all aware of plan for patient to transport via EMS to the hospice home with signed portable DNR in place. Thank you for the opportunity to serve Jill Clayton and her family. Jill BarkerKaren Robertson RN, BSN, [facility], [facility] [phone]

## 2014-09-25 NOTE — Progress Notes (Signed)
Speech Therapy Note: reviewed chart notes; consulted NSG re: pt's status. Pt remains on increased O2 support via ventimask and is moaning, not fully alert/awake. NSG indicated pt was not appropriate still d/t lethargy and medical status. Hospice consult is in place. ST will f/u tomorrow w/ pt's status and appropriateness for BSE. Pt remains NPO w/ oral care.

## 2014-09-25 NOTE — Progress Notes (Signed)
[facility] [place] [facility]   PATIENT NAME: Jill LighterHazel Clayton    MR#:  [id number]  DATE OF BIRTH:  31-Dec-1919  SUBJECTIVE: Continued to be lethargic. Has renal failure and worsening with hyperkalemia poor urine output. Patient seen by nephrololgy DR.Singh,and she has now  worsenign of acute on chronic rena failure wiith uremic symptoms.    Chief Complaint  Patient presents with  . Weakness  . Shortness of Breath    REVIEW OF SYSTEMS:    Review of Systems  Unable to perform ROS   Unable to obtain due to lethargy. Tolerating Diet:no Tolerating PT: no     DRUG ALLERGIES:  Not on File  VITALS:  Blood pressure 132/66, pulse 124, temperature 97.8 F (36.6 C), temperature source Oral, resp. rate 22, height 5\' 4"  (1.626 m), weight 45.133 kg (99 lb 8 oz), SpO2 97 %.  PHYSICAL EXAMINATION:   Physical Exam  GENERAL:  81 y.o.-year-old patient lying in the bed with respiratory distress EYES: Pupils equal, round, reactive to light and accommodation. No scleral icterus. Extraocular muscles intact.  HEENT: Head atraumatic, normocephalic. Oropharynx and nasopharynx clear.  NECK:  Supple, no jugular venous distention. No thyroid enlargement, no tenderness.  LUNGS coarse breath sounds bilaterally, use of accessory muscles of respiration.  CARDIOVASCULAR: S1, S2 normal. No murmurs, rubs, or gallops.  ABDOMEN: Soft, nontender, nondistended. Bowel sounds present. No organomegaly or mass.  EXT;Pedal edema present. No clubbing.  NEUROLOGIC: Cranial nerves II through XII are intact. Muscle strength 5/5 in all extremities. Sensation intact. Gait not checked.  PSYCHIATRIC: The patient is alert and oriented x 3.  SKIN: No obvious rash, lesion, or ulcer.    LABORATORY PANEL:   CBC  Recent Labs Lab 09/25/14 0421  WBC 13.0*  HGB 9.3*  HCT 28.2*  PLT 273    ------------------------------------------------------------------------------------------------------------------  Chemistries   Recent Labs Lab 09/25/14 0421 09/25/14 0856  NA 148*  --   K 6.1* 5.4*  CL 114*  --   CO2 17*  --   GLUCOSE 139*  --   BUN 79*  --   CREATININE 5.24*  --   CALCIUM 8.4*  --    ------------------------------------------------------------------------------------------------------------------  Cardiac Enzymes  Recent Labs Lab 09/23/14 2028  TROPONINI 1.19*   ------------------------------------------------------------------------------------------------------------------  RADIOLOGY:  No results found.   ASSESSMENT AND PLAN:   Active Problems:   Pneumonia   #1: Acute hypoxic respiratory failure secondary to pneumonia: Continue oxygen to keep sats more than 95%. continue vancomycin and  Zosyn. Critically ill l with acute respiratory failure. Advanced age and she is a DO NOT RESUSCITATE at this time. Continue nebulizers.  IV Solu-Medrol.  Critically ill,recommended  Comfort care measures at this time.   2. Acute renal failure  on chronic renal failure secondary to sepsis with the pneumonia.; Worsening renal function with the oliguria and hyperkalemia prognosis is poor: Patient condition is terminal not a candidate for hemodialysis secondary to advanced age and the dementia and the pneumonia and sepsis. Seen by Dr. Thedore MinsSingh from nephrology.  Discussed with the son ,recommends comfort care with o2,morphine, Hospice consult placed.   3.we'll hyperkalemia secondary to renal failure. Multiple doses of Kayexalate, calcium gluconate, insulin with dextrose, bicarbonate were given.   #4: Elevated troponin secondary to demand ischemia and renal failure and respiratory failure seen Dr. Lady GaryFATH and the following echocardiogram.  Prognosis guarded patient is at high risk for cardiopulmonary pulmonary arrest secondary to advanced age day and respiratory  failure and  renal failure. CODE STATUS DO NOT RESUSCITATE.   Hold the by mouth medications at this time secondary to lethargy And speech pathology evaluation when she becomes more alert.  All the records are reviewed and case discussed with Care Management/Social Workerr. Management plans discussed with the patient, family and they are in agreement.  CODE STATUS: DO NOT RESUSCITATE  TOTAL TIME TAKING CARE OF THIS PATIENT: 35  minutes minutes.   Not on vanco zosyn.  Katha Hamming M.D on 09/25/2014 at 9:51 AM  Between 7am to 6pm - Pager - [phone]  After 6pm go to [email or web address] - password [email or web address]  [facility]  Office  [phone]  CC: Primary care physician; Pcp Not In System

## 2014-09-25 NOTE — Progress Notes (Signed)
[facility]  SUBJECTIVE: Poor historian. Worsening renal function. Tachycardic this am to rates of 155-120   Filed Vitals:   09/25/14 0404 09/25/14 0427 09/25/14 0729 09/25/14 0730  BP:  132/66    Pulse:  118  124  Temp:  97.8 F (36.6 C)    TempSrc:  Oral    Resp:  22    Height:      Weight:      SpO2: 91% 94% 96% 97%    Intake/Output Summary (Last 24 hours) at 09/25/14 1112 Last data filed at 09/25/14 1020  Gross per 24 hour  Intake    210 ml  Output    250 ml  Net    -40 ml    LABS: Basic Metabolic Panel:  Recent Labs  16/12/9605/14/16 1113  09/25/14 0421 09/25/14 0856  NA 143  --  148*  --   K 6.0*  < > 6.1* 5.4*  CL 111  --  114*  --   CO2 19*  --  17*  --   GLUCOSE 116*  --  139*  --   BUN 59*  --  79*  --   CREATININE 4.56*  --  5.24*  --   CALCIUM 8.4*  --  8.4*  --   < > = values in this interval not displayed. Liver Function Tests: No results for input(s): AST, ALT, ALKPHOS, BILITOT, PROT, ALBUMIN in the last 72 hours. No results for input(s): LIPASE, AMYLASE in the last 72 hours. CBC:  Recent Labs  09/23/14 0855 09/24/14 0345 09/25/14 0421  WBC 22.0* 18.8* 13.0*  NEUTROABS 19.8*  --   --   HGB 10.0* 9.5* 9.3*  HCT 30.8* 28.6* 28.2*  MCV 104.4* 103.9* 103.1*  PLT 316 245 273   Cardiac Enzymes:  Recent Labs  09/23/14 0855 09/23/14 1450 09/23/14 2028  TROPONINI 0.67* 0.89* 1.19*   BNP: Invalid input(s): POCBNP D-Dimer: No results for input(s): DDIMER in the last 72 hours. Hemoglobin A1C: No results for input(s): HGBA1C in the last 72 hours. Fasting Lipid Panel: No results for input(s): CHOL, HDL, LDLCALC, TRIG, CHOLHDL, LDLDIRECT in the last 72 hours. Thyroid Function Tests: No results for input(s): TSH, T4TOTAL, T3FREE, THYROIDAB in the last 72 hours.  Invalid input(s): FREET3 Anemia Panel: No results for input(s): VITAMINB12, FOLATE, FERRITIN, TIBC, IRON, RETICCTPCT in the last 72  hours.   Physical Exam: Blood pressure 132/66, pulse 124, temperature 97.8 F (36.6 C), temperature source Oral, resp. rate 22, height 5\' 4"  (1.626 m), weight 45.133 kg (99 lb 8 oz), SpO2 97 %.   General appearance: moderate distress and slowed mentation Resp: rhonchi bilaterally Cardio: tachycardic GI: soft, non-tender; bowel sounds normal; no masses,  no organomegaly Pulses: 2+ and symmetric not palpable Neurologic: Mental status: incosistant response  TELEMETRY: Reviewed telemetry pt in tachcyardia; Afib with rvr vs sinus tach:  ASSESSMENT AND PLAN:  Active Problems:   Pneumonia-poor prognosis. Not imiproving clinically   CKD-acute on chronic renal failure. Cretinine incrased. Poor urine output Tachycardia-svt vs st. Secondary to multi system failure. Will discontinue carvedilol and try metoprolol 25 bid. Agree with prn iv metoprolol. Poor prognosis. WOuld agree with comfort measures    Dalia HeadingFATH,KENNETH A., MD, [facility] Center[facility]ACC 09/25/2014 11:12 AM

## 2014-09-25 NOTE — Discharge Summary (Signed)
Jill Clayton, is a 81 y.o. female  DOB [birth year]  MRN [id number].  Admission date:  09/23/2014  Admitting Physician  Auburn Bilberry, MD  Discharge Date:  09/25/2014   Primary MD  Pcp Not In System  Recommendations for primary care physician for things to follow:  Discharged to hospice home.   Admission Diagnosis  Multisystem organ failure [R69] Community acquired pneumonia [J18.9] Sepsis, due to unspecified organism [A41.9]   Discharge Diagnosis  Multisystem organ failure [R69] Community acquired pneumonia [J18.9] Sepsis, due to unspecified organism [A41.9]    Active Problems:   Pneumonia      Past Medical History  Diagnosis Date  . Anemia   . Chronic back pain   . Glaucoma   . Fibrocystic breast disease   . HTN (hypertension)   . TIA (transient ischemic attack)   . CAD (coronary artery disease)   . GERD (gastroesophageal reflux disease)   . B12 deficiency   . Chronic back pain   . Glaucoma   . H/O thyroid nodule   . Environmental allergies     Past Surgical History  Procedure Laterality Date  . Breast biopsy    . Basal cell cancer    . Cataract extraction    . Renal a stenosis         History of present illness and  Hospital Course:     Kindly see H&P for history of present illness and admission details, please review complete Labs, Consult reports and Test reports for all details in brief  HPI  from the history and physical done on the day of admission 81 year old female patient admitted secondary to shortness of breath found to have pneumonia. Patient admitted for hypoxic respiratory failure secondary to pneumonia. Patient has been lethargic since admission.   Hospital Course   #1 hypoxic respiratory failure secondary to pneumonia. Patient has been on 100% nonrebreather with admission  continued to be poorly responsive. Patient received a bank, Zosyn, Levaquin. She is a critically ill and the core status is DO NOT RESUSCITATE. Patient respiratory status continues to be worse. She also developed acute on chronic renal failure with severe hyperkalemia. Seen by Dr. Thedore Mins from nephrology. Patient received multiple doses of calcium gluconate, D50 with insulin, bicarbonate and Kayexalate. Patient has C [place] stage IV with uremic symptoms at this time with severe hyperkalemia. Patient is a seen by Dr. Thedore Mins and he suggested that the patient condition is terminal and she won't be able to tolerate hemodialysis. And I have discussed the case with the patient's son and also discussed with the hospice nurse. And patient is appropriate for hospice placement. And the she'll be made comfort care. Patient will be given morphine for the making her comfortable with her breathing and changed to Roxanol at hospice home. Discharge Condition: poor   Follow UP      Discharge Instructions  and  Discharge Medications        Medication List    ASK your doctor about these medications        acetaminophen 500 MG tablet  Commonly known as:  TYLENOL  Take 1,000 mg by mouth 2 (two) times daily.     amLODipine 5 MG tablet  Commonly known as:  NORVASC  Take 1 tablet by mouth daily.     aspirin 81 MG tablet  Take 81 mg by mouth daily.     buPROPion 100 MG 12 hr tablet  Commonly known as:  WELLBUTRIN SR  Take 1 tablet  by mouth 2 (two) times daily.     carvedilol 12.5 MG tablet  Commonly known as:  COREG  Take 1 tablet by mouth 2 (two) times daily.     dipyridamole 75 MG tablet  Commonly known as:  PERSANTINE  Take 1 tablet by mouth 2 (two) times daily.     ferrous sulfate 325 (65 FE) MG tablet  Take 325 mg by mouth 2 (two) times daily.     isosorbide mononitrate 60 MG 24 hr tablet  Commonly known as:  IMDUR  Take 1 tablet by mouth daily.     losartan 100 MG tablet  Commonly known  as:  COZAAR  Take 1 tablet by mouth every morning.     multivitamin capsule  Take 1 capsule by mouth daily.     OLANZapine 2.5 MG tablet  Commonly known as:  ZYPREXA  Take 1 tablet by mouth every evening.     omeprazole 20 MG capsule  Commonly known as:  PRILOSEC  Take 1 capsule by mouth every morning.     vitamin C 500 MG tablet  Commonly known as:  ASCORBIC ACID  Take 500 mg by mouth daily.          Diet and Activity recommendation: See Discharge Instructions above   Consults obtained - cardiology Nephrology Hospice screen  Major procedures and Radiology Reports - PLEASE review detailed and final reports for all details, in brief -      Dg Chest Portable 1 View  09/23/2014   CLINICAL DATA:  Respiratory distress. Difficulty breathing. Initial encounter.  EXAM: PORTABLE CHEST - 1 VIEW  COMPARISON:  None.  FINDINGS: The cardiopericardial silhouette is enlarged. Severe background emphysema is present. There is blunting of the RIGHT costophrenic angle compatible with a small pleural effusion. LEFT perihilar airspace disease is present. There is also RIGHT upper lobe airspace disease. Underlying pulmonary parenchymal scarring is present, most severe at the RIGHT lung base. Tortuous aorta with atherosclerotic calcification.  IMPRESSION: 1. Cardiomegaly with LEFT perihilar airspace disease which may represent asymmetric pulmonary edema or pneumonia. 2. RIGHT upper lobe airspace opacity with similar differential considerations. 3. Severe emphysema. 4. Small RIGHT pleural effusion. 5. Underlying pulmonary parenchymal scarring. Although difficult to evaluate based on other superimposed findings, pulmonary fibrosis is suspected.   Electronically Signed   By: Andreas Newport M.D.   On: 09/23/2014 09:21    Micro Results     Recent Results (from the past 240 hour(s))  Blood culture (routine x 2)     Status: None (Preliminary result)   Collection Time: 09/23/14  9:30 AM  Result Value  Ref Range Status   Specimen Description BLOOD LEFT WRIST  Final   Special Requests   Final    BOTTLES DRAWN AEROBIC AND ANAEROBIC  1 CC ANAEROBIC, 2 CC AEROBIC   Culture NO GROWTH 2 DAYS  Final   Report Status PENDING  Incomplete  Blood culture (routine x 2)     Status: None (Preliminary result)   Collection Time: 09/23/14  9:51 AM  Result Value Ref Range Status   Specimen Description BLOOD RIGHT WRIST  Final   Special Requests BOTTLES DRAWN AEROBIC AND ANAEROBIC  1 CC  Final   Culture  Setup Time   Final    GRAM POSITIVE COCCI AEROBIC BOTTLE ONLY CRITICAL RESULT CALLED TO, READ BACK BY AND VERIFIED WITH: SMG TO RACHEL DAVID @ [phone] [id number] CONFIRMED BY TSH    Culture   Final    COAGULASE  NEGATIVE STAPHYLOCOCCUS AEROBIC BOTTLE ONLY POSSIBLE CONTAMINATION WITH SKIN FLORA    Report Status PENDING  Incomplete       Today   Subjective:   Jill Clayton  Lethargic,critically ill.  Objective:   Blood pressure 132/66, pulse 124, temperature 97.8 F (36.6 C), temperature source Oral, resp. rate 22, height 5\' 4"  (1.626 m), weight 45.133 kg (99 lb 8 oz), SpO2 97 %.   Intake/Output Summary (Last 24 hours) at 09/25/14 1131 Last data filed at 09/25/14 1020  Gross per 24 hour  Intake    210 ml  Output    250 ml  Net    -40 ml    Exam Awake Alert, Oriented x 3, No new F.N deficits, Normal affect Diamond.AT,PERRAL Supple Neck,No JVD, No cervical lymphadenopathy appriciated.  Symmetrical Chest wall movement, Good air movement bilaterally, CTAB RRR,No Gallops,Rubs or new Murmurs, No Parasternal Heave +ve B.Sounds, Abd Soft, Non tender, No organomegaly appriciated, No rebound -guarding or rigidity. No Cyanosis, Clubbing or edema, No new Rash or bruise  Data Review   CBC w Diff: Lab Results  Component Value Date   WBC 13.0* 09/25/2014   HGB 9.3* 09/25/2014   HCT 28.2* 09/25/2014   PLT 273 09/25/2014   LYMPHOPCT 2 09/23/2014   MONOPCT 8 09/23/2014   EOSPCT 0 09/23/2014    BASOPCT 0 09/23/2014    CMP: Lab Results  Component Value Date   NA 148* 09/25/2014   K 5.4* 09/25/2014   CL 114* 09/25/2014   CO2 17* 09/25/2014   BUN 79* 09/25/2014   CREATININE 5.24* 09/25/2014  . D/c to hospice home  When arrangements  are made.  Total Time in preparing paper work, data evaluation and todays exam - 35 minutes  Lourine Alberico M.D on 09/25/2014 at 11:31 AM

## 2014-09-25 NOTE — Care Management Important Message (Signed)
Important Message  Patient Details  Name: Jill Clayton MRN: [id number] Date of Birth: 1919-06-24   Medicare Important Message Given:  Yes-second notification given    Olegario MessierKathy A Allmond 09/25/2014, 10:38 AM

## 2014-09-28 LAB — CULTURE, BLOOD (ROUTINE X 2): Culture: NO GROWTH

## 2014-10-12 DEATH — deceased
